# Patient Record
Sex: Male | Born: 1960 | Race: Black or African American | Hispanic: No | Marital: Married | Smoking: Never smoker
Health system: Southern US, Community
[De-identification: ages and names within clinical notes are randomized; demographics above are authoritative.]

## PROBLEM LIST (undated history)

## (undated) ENCOUNTER — Emergency Department (HOSPITAL_COMMUNITY): Admission: EM | Payer: Self-pay

## (undated) DIAGNOSIS — E119 Type 2 diabetes mellitus without complications: Secondary | ICD-10-CM

## (undated) DIAGNOSIS — E669 Obesity, unspecified: Secondary | ICD-10-CM

## (undated) DIAGNOSIS — I10 Essential (primary) hypertension: Secondary | ICD-10-CM

## (undated) HISTORY — DX: Obesity, unspecified: E66.9

## (undated) HISTORY — PX: ANKLE SURGERY: SHX546

## (undated) HISTORY — DX: Type 2 diabetes mellitus without complications: E11.9

## (undated) HISTORY — PX: LEG SURGERY: SHX1003

## (undated) HISTORY — DX: Essential (primary) hypertension: I10

---

## 2004-07-24 ENCOUNTER — Emergency Department (HOSPITAL_COMMUNITY): Admission: EM | Admit: 2004-07-24 | Discharge: 2004-07-24 | Payer: Self-pay | Admitting: Family Medicine

## 2004-08-23 ENCOUNTER — Emergency Department (HOSPITAL_COMMUNITY): Admission: EM | Admit: 2004-08-23 | Discharge: 2004-08-23 | Payer: Self-pay | Admitting: Family Medicine

## 2006-08-23 ENCOUNTER — Emergency Department (HOSPITAL_COMMUNITY): Admission: EM | Admit: 2006-08-23 | Discharge: 2006-08-23 | Payer: Self-pay | Admitting: Emergency Medicine
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
## 2012-12-31 ENCOUNTER — Ambulatory Visit: Payer: Self-pay | Admitting: Emergency Medicine

## 2012-12-31 VITALS — BP 136/84 | HR 72 | Temp 98.7°F | Resp 18 | Ht 68.5 in | Wt 274.4 lb

## 2012-12-31 DIAGNOSIS — Z0289 Encounter for other administrative examinations: Secondary | ICD-10-CM

## 2012-12-31 NOTE — Patient Instructions (Addendum)

## 2012-12-31 NOTE — Progress Notes (Signed)
[facility] [address] [place] [address] [phone]  Date:  12/31/2012   Name:  Daryl Clark   DOB:  1960-11-19   MRN:  [id number]  PCP:  No primary provider on file.    Chief Complaint: Annual Exam   History of Present Illness:  Daryl Clark is a 52 y.o. very pleasant male patient who presents with the following:  DOT certification.  Denies excess daytime sleepiness.    There are no active problems to display for this patient.   History reviewed. No pertinent past medical history.  History reviewed. No pertinent past surgical history.  History  Substance Use Topics  . Smoking status: Never Smoker   . Smokeless tobacco: Not on file  . Alcohol Use: Not on file    History reviewed. No pertinent family history.  No Known Allergies  Medication list has been reviewed and updated.  No current outpatient prescriptions on file prior to visit.   No current facility-administered medications on file prior to visit.    Review of Systems:  As per HPI, otherwise negative.    Physical Examination: Filed Vitals:   12/31/12 0950  BP: 136/84  Pulse: 72  Temp: 98.7 F (37.1 C)  Resp: 18   Filed Vitals:   12/31/12 0950  Height: 5' 8.5" (1.74 m)  Weight: 274 lb 6.4 oz (124.467 kg)   Body mass index is 41.11 kg/(m^2). Ideal Body Weight: Weight in (lb) to have BMI = 25: 166.5  GEN: WDWN, NAD, Non-toxic, A & O x 3 HEENT: Atraumatic, Normocephalic. Neck supple. No masses, No LAD. Ears and Nose: No external deformity. CV: RRR, No M/G/R. No JVD. No thrill. No extra heart sounds. PULM: CTA B, no wheezes, crackles, rhonchi. No retractions. No resp. distress. No accessory muscle use. ABD: S, NT, ND, +BS. No rebound. No HSM. EXTR: No c/c/e NEURO Normal gait.  PSYCH: Normally interactive. Conversant. Not depressed or anxious appearing.  Calm demeanor.    Assessment and Plan: DOT certification   Signed,  Phillips Odor,  MD

## 2013-01-15 ENCOUNTER — Encounter: Payer: Self-pay | Admitting: Emergency Medicine

## 2013-02-27 ENCOUNTER — Encounter (INDEPENDENT_AMBULATORY_CARE_PROVIDER_SITE_OTHER): Payer: Self-pay

## 2013-02-27 ENCOUNTER — Encounter: Payer: Self-pay | Admitting: Neurology

## 2013-02-27 ENCOUNTER — Ambulatory Visit (INDEPENDENT_AMBULATORY_CARE_PROVIDER_SITE_OTHER): Payer: Managed Care, Other (non HMO) | Admitting: Neurology

## 2013-02-27 VITALS — BP 138/89 | HR 65 | Temp 98.9°F | Ht 68.5 in | Wt 268.0 lb

## 2013-02-27 DIAGNOSIS — E669 Obesity, unspecified: Secondary | ICD-10-CM | POA: Insufficient documentation

## 2013-02-27 DIAGNOSIS — G4733 Obstructive sleep apnea (adult) (pediatric): Secondary | ICD-10-CM

## 2013-02-27 NOTE — Progress Notes (Signed)
Subjective:    Patient ID: Daryl Clark is a 52 y.o. male.  HPI  Daryl Foley, MD, PhD [facility] [address] [address] [place], [place] [address]  Dear Dr. Dareen Piano,  I saw your patient, Daryl Clark, upon your kind request in my neurologic clinic today for initial sleep evaluation as part of his DOT requirement. The patient is unaccompanied today. As you know, Mr. 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 He reports snoring and has been told in the past, that he has breathing pauses while asleep. He denies a sense of choking or coughing at night. His bedtime is around 10 PM and watches TV and often falls asleep with the TV on, but will turn it off when he wakes up in the night. He wakes up in the middle of the night 2 times, to use the bathroom. He wakes up generally fairly well rested and denies AM HAs, or EDS and his ESS is 4/24 today. He is anxious to get his DOT re-certification and his BP is a little higher than usual. He has gained wt in the last 12 months, as he is not walking as much. He quit smoking some 5-6 years ago. He drinks alcohol occasionally, 2 beers/week. He drinks tea throughout the day, latest at 2-3 PM. There is no FHx of OSA and the patient denies RLS Sx. The patient has not been taking a scheduled nap.  He has been known to snore for the past many years. Snoring is reportedly moderate, and associated with choking sounds and witnessed apneas. He is not known to kick while asleep or before falling asleep. He is not a very restless sleeper.   He denies cataplexy, sleep paralysis, hypnagogic or hypnopompic hallucinations, or sleep attacks. He does not report any vivid dreams, nightmares, dream enactments, or parasomnias, such as sleep talking or sleep walking. The patient has not had a sleep study or a home  sleep test.   His Past Medical History Is Significant For: Past Medical History  Diagnosis Date  . Obesity     His Past Surgical History Is Significant For: Past Surgical History  Procedure Laterality Date  . Ankle surgery Left   . Leg surgery Right     His Family History Is Significant For: History reviewed. No pertinent family history.  His Social History Is Significant For: History   Social History  . Marital Status: Married    Spouse Name: Daryl Clark    Number of Children: 1  . Years of Education: 12th   Occupational History  . PDA    Social History Main Topics  . Smoking status: Never Smoker   . Smokeless tobacco: None  . Alcohol Use: Yes     Comment: Occ. 2 drinks per week  . Drug Use: No  . Sexual Activity: Yes   Other Topics Concern  . None   Social History Narrative   Patient lives at home with his spouse.          His Allergies Are:  No Known Allergies:   His Current Medications Are:  No outpatient encounter prescriptions on file as of 02/27/2013.   No facility-administered encounter medications on file as of 02/27/2013.  :  Review of Systems:  Out of a complete 14 point review of systems, all are reviewed and negative with the exception of these symptoms as listed below:  Review of Systems  All other systems reviewed and are negative.    Objective:  Neurologic Exam  Physical Exam Physical Examination:   Filed Vitals:   02/27/13 0820  BP: 138/89  Pulse: 65  Temp: 98.9 F (37.2 C)    General Examination: The patient is a very pleasant 52 y.o. male in no acute distress. He appears well-developed and well-nourished and well groomed. He is obese.   HEENT: Normocephalic, atraumatic, pupils are equal, round and reactive to light and accommodation. Funduscopic exam is normal with sharp disc margins noted. Extraocular tracking is good without limitation to gaze excursion or nystagmus noted. Normal smooth pursuit is noted. Hearing is  grossly intact. Tympanic membranes are clear bilaterally. Face is symmetric with normal facial animation and normal facial sensation. Speech is clear with no dysarthria noted. There is no hypophonia. There is no lip, neck/head, jaw or voice tremor. Neck is supple with full range of passive and active motion. There are no carotid bruits on auscultation. Oropharynx exam reveals: mild mouth dryness, adequate dental hygiene and marked airway crowding, due to large tongue, narrow airway entry and tonsils in place. Mallampati is class III. Tongue protrudes centrally and palate elevates symmetrically. Tonsils are 1+ in size. Neck size is 17.75 inches.   Chest: Clear to auscultation without wheezing, rhonchi or crackles noted.  Heart: S1+S2+0, regular and normal without murmurs, rubs or gallops noted.   Abdomen: Soft, non-tender and non-distended with normal bowel sounds appreciated on auscultation.   Extremities: There is no pitting edema in the distal lower extremities bilaterally. Pedal pulses are intact. L ankle scar and mild L ankle enlargement are noted from prior injury and surgery.   Skin: Warm and dry without trophic changes noted. There are no varicose veins.  Musculoskeletal: exam reveals no obvious joint deformities, tenderness or joint swelling or erythema.   Neurologically:  Mental status: The patient is awake, alert and oriented in all 4 spheres. His memory, attention, language and knowledge are appropriate. There is no aphasia, agnosia, apraxia or anomia. Speech is clear with normal prosody and enunciation. Thought process is linear. Mood is congruent and affect is normal.  Cranial nerves are as described above under HEENT exam. In addition, shoulder shrug is normal with equal shoulder height noted. Motor exam: Normal bulk, strength and tone is noted. There is no drift, tremor or rebound. Romberg is negative. Reflexes are 2+ throughout. Toes are downgoing bilaterally. Fine motor skills are  intact with normal finger taps, normal hand movements, normal rapid alternating patting, normal foot taps and normal foot agility.  Cerebellar testing shows no dysmetria or intention tremor on finger to nose testing. Heel to shin is unremarkable bilaterally. There is no truncal or gait ataxia.  Sensory exam is intact to light touch, pinprick, vibration, temperature sense and proprioception in the upper and lower extremities.  Gait, station and balance are unremarkable. No veering to one side is noted. No leaning to one side is noted. Posture is age-appropriate and stance is narrow based. No problems turning are noted. He turns en bloc. Tandem walk is unremarkable. Intact toe and heel stance is noted.               Assessment and Plan:   In summary, MYKAEL BATZ is a very pleasant 52 y.o.-year old male with a history and physical exam concerning for obstructive sleep apnea (OSA). I had a long chat with the patient about my findings and the diagnosis, its prognosis and treatment options.  We talked about medical treatments and non-pharmacological approaches. I explained in particular the risks and ramifications of untreated moderate to severe OSA, especially with respect to developing cardiovascular disease down the Road, including congestive heart failure, difficult to treat hypertension, cardiac arrhythmias, or stroke. Even type 2 diabetes has in part been linked to untreated OSA. We talked about trying to maintain a healthy lifestyle in general, as well as the importance of weight control. I encouraged the patient to eat healthy, exercise daily and keep well hydrated, to keep a scheduled bedtime and wake time routine, to not skip any meals and eat healthy snacks in between meals.  I recommended the following at this time: sleep study with potential positive airway pressure titration.  I explained the sleep test procedure to the patient and also outlined possible surgical and non-surgical treatment  options of OSA, including the use of a custom-made dental device, upper airway surgical options, such as pillar implants, radiofrequency surgery, tongue base surgery, and UPPP. I also explained the CPAP treatment option to the patient, who indicated that he would be willing to try CPAP if the need arises. I explained the importance of being compliant with PAP treatment, not only for insurance purposes but primarily to improve His symptoms, and for the patient's long term health benefit, including to reduce His cardiovascular risks. I answered all his questions today and the patient was in agreement. I would like to see him back after the sleep study is completed and encouraged him to call with any interim questions, concerns, problems or updates.   Thank you very much for allowing me to participate in the care of this nice patient. If I can be of any further assistance to you please do not hesitate to call me at [phone].  Sincerely,   Daryl Foley, MD, PhD

## 2013-02-27 NOTE — Patient Instructions (Signed)

## 2013-03-18 ENCOUNTER — Ambulatory Visit (INDEPENDENT_AMBULATORY_CARE_PROVIDER_SITE_OTHER): Payer: Managed Care, Other (non HMO)

## 2013-03-18 DIAGNOSIS — G479 Sleep disorder, unspecified: Secondary | ICD-10-CM

## 2013-03-18 DIAGNOSIS — G4733 Obstructive sleep apnea (adult) (pediatric): Secondary | ICD-10-CM

## 2013-04-01 ENCOUNTER — Telehealth: Payer: Self-pay | Admitting: Neurology

## 2013-04-01 NOTE — Telephone Encounter (Signed)
I called and left a message for the patient that his recent sleep study showed mild obstructive sleep apnea and that Dr. Frances Furbish is recommending that he sleep off his back because that when it seems and pursue weight loss. I will fax Dr. Tinnie Gens Anderson's office a copy of the report and mail a copy to the patient's home.

## 2013-04-01 NOTE — Telephone Encounter (Signed)
Please call and notify the patient that the recent sleep study showed mild obstructive sleep apnea generally speaking. He has evidence of moderated sleep apnea only when sleeping on the back and overall, findings were mild. For this reason, He is advised to sleep off His back if possible for sleep and to pursue weight loss. Please inform patient that he can FU with PCP and I can also see him back to discuss any other treatment options, but generally speaking, for his mild overall obstructive sleep apnea, staying off his back and weight loss may suffice as treatment. Also, route or fax report to PCP and referring provider, if other than PCP. Please offer him a FU appt with me as well.  Once you have spoken to patient, you can close this encounter.   Thanks,  Huston Foley, MD, PhD [facility])

## 2013-04-01 NOTE — Telephone Encounter (Signed)
sa

## 2013-04-02 ENCOUNTER — Encounter: Payer: Self-pay | Admitting: *Deleted

## 2014-01-21 ENCOUNTER — Telehealth: Payer: Self-pay | Admitting: Emergency Medicine

## 2014-01-21 NOTE — Telephone Encounter (Signed)
Patient was seen on 12/31/2012 for a DOT PE. Patient states that he just realized today that his card was only good until 04/01/2013 (a 3 month card). Patient thought his card was written for two years. Patient states that he needs a new card immediately as in today or tomorrow. Explained to patient that because it has been over a year we cannot just write him another card without him having an OV. States that he also had a sleep apnea test done around that time as well and that his notes from the study should have been faxed here. Told patient I will deliver this message however he may have to come in for an OV in order to get another card. Please advise.    [phone]

## 2014-01-22 NOTE — Telephone Encounter (Signed)
I called and spoke with pt. He thinks his DOT card should be good until December 2015 because his sleep study was done in December of 2014. Per Dr. Dareen Piano he extended his DOT card until 12/31/2013 and instructed the pt that he needs to return in 2015 to receive another DOT card. Also per Dr. Dareen Piano if he has gained any weight since last visit he needs to have another sleep study. Pt became upset and hung up on me. Spoke to [place] and she is going to call the patient.

## 2015-01-27 ENCOUNTER — Emergency Department (HOSPITAL_COMMUNITY)
Admission: EM | Admit: 2015-01-27 | Discharge: 2015-01-27 | Disposition: A | Discharge status: Home / Self Care | Payer: Managed Care, Other (non HMO) | Attending: Emergency Medicine | Admitting: Emergency Medicine

## 2015-01-27 ENCOUNTER — Encounter (HOSPITAL_COMMUNITY): Payer: Self-pay | Admitting: *Deleted

## 2015-01-27 DIAGNOSIS — E669 Obesity, unspecified: Secondary | ICD-10-CM | POA: Diagnosis not present

## 2015-01-27 DIAGNOSIS — K625 Hemorrhage of anus and rectum: Secondary | ICD-10-CM | POA: Insufficient documentation

## 2015-01-27 LAB — I-STAT CHEM 8, ED
BUN: 16 mg/dL (ref 6–20)
CREATININE: 1.2 mg/dL (ref 0.61–1.24)
Calcium, Ion: 1.06 mmol/L — ABNORMAL LOW (ref 1.12–1.23)
Chloride: 104 mmol/L (ref 101–111)
GLUCOSE: 140 mg/dL — AB (ref 65–99)
HCT: 42 % (ref 39.0–52.0)
HEMOGLOBIN: 14.3 g/dL (ref 13.0–17.0)
POTASSIUM: 4 mmol/L (ref 3.5–5.1)
Sodium: 140 mmol/L (ref 135–145)
TCO2: 24 mmol/L (ref 0–100)

## 2015-01-27 LAB — POC OCCULT BLOOD, ED: Fecal Occult Bld: NEGATIVE

## 2015-01-27 NOTE — ED Provider Notes (Signed)
CSN: [id number]     Arrival date & time 01/27/15  1729 History   First MD Initiated Contact with Patient 01/27/15 2029     Chief Complaint  Patient presents with  . Blood In Stools     (Consider location/radiation/quality/duration/timing/severity/associated sxs/prior Treatment) Patient is a 54 y.o. male presenting with hematochezia. The history is provided by the patient.  Rectal Bleeding Quality:  Bright red Amount:  Moderate Duration:  1 day Timing:  Rare Progression:  Resolved Chronicity:  New Context: not anal fissures, not anal penetration, not constipation and not diarrhea   Similar prior episodes: no   Relieved by:  Nothing Worsened by:  Nothing tried Ineffective treatments:  None tried Associated symptoms: abdominal pain (RLQ for about 10 min, resolved)   Associated symptoms: no fever and no vomiting    54 yo M with a chief complaint of rectal bleeding. Patient had one episode this morning. Since then has had more episodes that have not had blood in it. Stool is soft and brown. Denies fevers or chills. Had some mild right lower quadrant cramping just prior to the bright red blood in the bowl. Denies near-syncope feeling. Denies fevers or chills. Denies current abdominal pain.  Past Medical History  Diagnosis Date  . Obesity    Past Surgical History  Procedure Laterality Date  . Ankle surgery Left   . Leg surgery Right    History reviewed. No pertinent family history. Social History  Substance Use Topics  . Smoking status: Never Smoker   . Smokeless tobacco: None  . Alcohol Use: Yes     Comment: Occ. 2 drinks per week    Review of Systems  Constitutional: Negative for fever and chills.  HENT: Negative for congestion and facial swelling.   Eyes: Negative for discharge and visual disturbance.  Respiratory: Negative for shortness of breath.   Cardiovascular: Negative for chest pain and palpitations.  Gastrointestinal: Positive for abdominal pain (RLQ for about  10 min, resolved), blood in stool and hematochezia. Negative for vomiting and diarrhea.  Musculoskeletal: Negative for myalgias and arthralgias.  Skin: Negative for color change and rash.  Neurological: Negative for tremors, syncope and headaches.  Psychiatric/Behavioral: Negative for confusion and dysphoric mood.      Allergies  Review of patient's allergies indicates no known allergies.  Home Medications   Prior to Admission medications   Medication Sig Start Date End Date Taking? Authorizing Provider  acetaminophen (TYLENOL) 500 MG tablet Take 500 mg by mouth every 6 (six) hours as needed.   Yes Historical Provider, MD   BP 131/82 mmHg  Pulse 69  Temp(Src) 98.6 F (37 C) (Oral)  Resp 20  SpO2 97% Physical Exam  Constitutional: He is oriented to person, place, and time. He appears well-developed and well-nourished.  HENT:  Head: Normocephalic and atraumatic.  Eyes: EOM are normal. Pupils are equal, round, and reactive to light.  Neck: Normal range of motion. Neck supple. No JVD present.  Cardiovascular: Normal rate and regular rhythm.  Exam reveals no gallop and no friction rub.   No murmur heard. Pulmonary/Chest: No respiratory distress. He has no wheezes.  Abdominal: He exhibits no distension. There is no tenderness. There is no rebound and no guarding.  Musculoskeletal: Normal range of motion.  Neurological: He is alert and oriented to person, place, and time.  Skin: No rash noted. No pallor.  Psychiatric: He has a normal mood and affect. His behavior is normal.    ED Course  Procedures (including  critical care time) Labs Review Labs Reviewed  I-STAT CHEM 8, ED - Abnormal; Notable for the following:    Glucose, Bld 140 (*)    Calcium, Ion 1.06 (*)    All other components within normal limits  POC OCCULT BLOOD, ED    Imaging Review No results found. I have personally reviewed and evaluated these images and lab results as part of my medical decision-making.    EKG Interpretation None      MDM   Final diagnoses:  BRBPR (bright red blood per rectum)    54 year old male with a chief complaint rectal bleeding. Rectal exam reveals soft brown stool heme-negative. Hemoglobin normal. Will discharge home. PCP follow-up.  9:42 PM:  I have discussed the diagnosis/risks/treatment options with the patient and family and believe the pt to be eligible for discharge home to follow-up with PCP. We also discussed returning to the ED immediately if new or worsening sx occur. We discussed the sx which are most concerning (e.g., sudden worsening, near syncope, abdominal pain, fever) that necessitate immediate return. Medications administered to the patient during their visit and any new prescriptions provided to the patient are listed below.  Medications given during this visit Medications - No data to display  New Prescriptions   No medications on file     The patient appears reasonably screen and/or stabilized for discharge and I doubt any other medical condition or other [facility] requiring further screening, evaluation, or treatment in the ED at this time prior to discharge.      Melene Plan, DO 01/27/15 2142

## 2015-01-27 NOTE — ED Notes (Signed)
Patient reports blood in stool this am, states no blood noted in stool tonight however. Denies any abdominal pain. Blood noted to be right red. Denies any dizziness or weakness.

## 2015-01-27 NOTE — Discharge Instructions (Signed)
Bloody Stools  Bloody stools often mean that there is a problem in the digestive tract. Your caregiver may use the term "melena" to describe black, tarry, and bad smelling stools or "hematochezia" to describe red or maroon-colored stools. Blood seen in the stool can be caused by bleeding anywhere along the intestinal tract.   A black stool usually means that blood is coming from the upper part of the gastrointestinal tract (esophagus, stomach, or small bowel). Passing maroon-colored stools or bright red blood usually means that blood is coming from lower down in the large bowel or the rectum. However, sometimes massive bleeding in the stomach or small intestine can cause bright red bloody stools.   Consuming black licorice, lead, iron pills, medicines containing bismuth subsalicylate, or blueberries can also cause black stools. Your caregiver can test black stools to see if blood is present.  It is important that the cause of the bleeding be found. Treatment can then be started, and the problem can be corrected. Rectal bleeding may not be serious, but you should not assume everything is okay until you know the cause. It is very important to follow up with your caregiver or a specialist in gastrointestinal problems.  CAUSES   Blood in the stools can come from various underlying causes. Often, the cause is not found during your first visit. Testing is often needed to discover the cause of bleeding in the gastrointestinal tract. Causes range from simple to serious or even life-threatening. Possible causes include:  · Hemorrhoids. These are veins that are full of blood (engorged) in the rectum. They cause pain, inflammation, and may bleed.  · Anal fissures. These are areas of painful tearing which may bleed. They are often caused by passing hard stool.  · Diverticulosis. These are pouches that form on the colon over time, with age, and may bleed significantly.  · Diverticulitis. This is inflammation in areas with  diverticulosis. It can cause pain, fever, and bloody stools, although bleeding is rare.  · Proctitis and colitis. These are inflamed areas of the rectum or colon. They may cause pain, fever, and bloody stools.  · Polyps and cancer. Colon cancer is a leading cause of preventable cancer death. It often starts out as precancerous polyps that can be removed during a colonoscopy, preventing progression into cancer. Sometimes, polyps and cancer may cause rectal bleeding.  · Gastritis and ulcers. Bleeding from the upper gastrointestinal tract (near the stomach) may travel through the intestines and produce black, sometimes tarry, often bad smelling stools. In certain cases, if the bleeding is fast enough, the stools may not be black, but red and the condition may be life-threatening.  SYMPTOMS   You may have stools that are bright red and bloody, that are normal color with blood on them, or that are dark black and tarry. In some cases, you may only have blood in the toilet bowl. Any of these cases need medical care. You may also have:  · Pain at the anus or anywhere in the rectum.  · Lightheadedness or feeling faint.  · Extreme weakness.  · Nausea or vomiting.  · Fever.  DIAGNOSIS  Your caregiver may use the following methods to find the cause of your bleeding:  · Taking a medical history. Age is important. Older people tend to develop polyps and cancer more often. If there is anal pain and a hard, large stool associated with bleeding, a tear of the anus may be the cause. If blood drips into the toilet after a bowel movement, bleeding hemorrhoids may be the   problem. The color and frequency of the bleeding are additional considerations. In most cases, the medical history provides clues, but seldom the final answer.  · A visual and finger (digital) exam. Your caregiver will inspect the anal area, looking for tears and hemorrhoids. A finger exam can provide information when there is tenderness or a growth inside. In men, the  prostate is also examined.  · Endoscopy. Several types of small, long scopes (endoscopes) are used to view the colon.  ¨ In the office, your caregiver may use a rigid, or more commonly, a flexible viewing sigmoidoscope. This exam is called flexible sigmoidoscopy. It is performed in 5 to 10 minutes.  ¨ A more thorough exam is accomplished with a colonoscope. It allows your caregiver to view the entire 5 to 6 foot long colon. Medicine to help you relax (sedative) is usually given for this exam. Frequently, a bleeding lesion may be present beyond the reach of the sigmoidoscope. So, a colonoscopy may be the best exam to start with. Both exams are usually done on an outpatient basis. This means the patient does not stay overnight in the hospital or surgery center.  ¨ An upper endoscopy may be needed to examine your stomach. Sedation is used and a flexible endoscope is put in your mouth, down to your stomach.  · A barium enema X-ray. This is an X-ray exam. It uses liquid barium inserted by enema into the rectum. This test alone may not identify an actual bleeding point. X-rays highlight abnormal shadows, such as those made by lumps (tumors), diverticuli, or colitis.  TREATMENT   Treatment depends on the cause of your bleeding.   · For bleeding from the stomach or colon, the caregiver doing your endoscopy or colonoscopy may be able to stop the bleeding as part of the procedure.  · Inflammation or infection of the colon can be treated with medicines.  · Many rectal problems can be treated with creams, suppositories, or warm baths.  · Surgery is sometimes needed.  · Blood transfusions are sometimes needed if you have lost a lot of blood.  · For any bleeding problem, let your caregiver know if you take aspirin or other blood thinners regularly.  HOME CARE INSTRUCTIONS   · Take any medicines exactly as prescribed.  · Keep your stools soft by eating a diet high in fiber. Prunes (1 to 3 a day) work well for many people.  · Drink  enough water and fluids to keep your urine clear or pale yellow.  · Take sitz baths if advised. A sitz bath is when you sit in a bathtub with warm water for 10 to 15 minutes to soak, soothe, and cleanse the rectal area.  · If enemas or suppositories are advised, be sure you know how to use them. Tell your caregiver if you have problems with this.  · Monitor your bowel movements to look for signs of improvement or worsening.  SEEK MEDICAL CARE IF:   · You do not improve in the time expected.  · Your condition worsens after initial improvement.  · You develop any new symptoms.  SEEK IMMEDIATE MEDICAL CARE IF:   · You develop severe or prolonged rectal bleeding.  · You vomit blood.  · You feel weak or faint.  · You have a fever.  MAKE SURE YOU:  · Understand these instructions.  · Will watch your condition.  · Will get help right away if you are not doing well or get worse.    Document Released: 04/08/2002 Document Revised: 07/11/2011 Document Reviewed: 09/03/2010  [employer]® Patient Information ©2015 [employer]. This information is not intended to replace advice given to you by your health care provider. Make sure you discuss any questions you have with your health care provider.

## 2017-05-02 DIAGNOSIS — I10 Essential (primary) hypertension: Secondary | ICD-10-CM

## 2017-05-02 HISTORY — DX: Essential (primary) hypertension: I10

## 2018-05-02 HISTORY — PX: COLONOSCOPY: SHX174

## 2018-07-20 ENCOUNTER — Other Ambulatory Visit: Payer: Self-pay

## 2018-07-20 ENCOUNTER — Encounter: Payer: Self-pay | Admitting: Medical

## 2018-07-20 ENCOUNTER — Ambulatory Visit: Payer: Managed Care, Other (non HMO) | Admitting: Medical

## 2018-07-20 VITALS — BP 160/80 | HR 102 | Temp 98.5°F | Resp 16 | Ht 68.0 in | Wt 272.6 lb

## 2018-07-20 DIAGNOSIS — R202 Paresthesia of skin: Secondary | ICD-10-CM

## 2018-07-20 DIAGNOSIS — I10 Essential (primary) hypertension: Secondary | ICD-10-CM

## 2018-07-20 MED ORDER — AMLODIPINE BESYLATE 10 MG PO TABS: 10.0000 mg | ORAL_TABLET | Freq: Every day | ORAL | 0 refills | Status: DC

## 2018-07-20 NOTE — Progress Notes (Signed)
Subjective:     Patient ID: Daryl Clark, male   DOB: [birth year], 58 y.o.   MRN: [id number]  HPI  Here as a new patient today.  Was going to [facility] in [place] prior.  He report left hand numbness intermittent and soreness x 1 months.  No specific injury, but does recall lifting a garage door a month ago. Didn't think this caused the issue at the time, but that is the only different thing out of the ordinary he recalls in the last month.   No weakness, no frank pain, just soreness in left wrist and hand. When he gets the numbness its in the whole hand.  Denies dropping objects.   No redness, no swelling, no other arm pain or numbness.  No neck pain, no shoulder or upper arm pain.   No right arm problems.  Is right handed .  Is a delivery driver.   No medication to help.   Hasn't used a brace or splint.     Has hx/o HTN.   Has been on medication in the past.   Last BP medication was 2 years ago.    No chest pain, no dyspnea, no leg swelling.   Exercise with walking and playing with grandchildren.  Has gained some weight over time.  No other aggravating or relieving factors. No other complaint.   Past Medical History:  Diagnosis Date  . Hypertension   . Obesity    Current Outpatient Medications on File Prior to Visit  Medication Sig Dispense Refill  . acetaminophen (TYLENOL) 500 MG tablet Take 500 mg by mouth every 6 (six) hours as needed.     No current facility-administered medications on file prior to visit.       Review of Systems As in subjective    Objective:   Physical Exam BP (!) 160/80   Pulse (!) 102   Temp 98.5 F (36.9 C) (Oral)   Resp 16   Ht 5\' 8"  (1.727 m)   Wt 272 lb 9.6 oz (123.7 kg)   SpO2 97%   BMI 41.45 kg/m   General appearance: alert, no distress, WD/WN, obese AA male Neck: supple, no lymphadenopathy, no thyromegaly, no masses, nonntender, normal ROM Heart: RRR, normal S1, S2, no murmurs Lungs: CTA bilaterally, no wheezes, rhonchi, or  rales Left hand, wrist, arm nontender, no deformity, no swelling, rest of UE unremarkable Neuro: bilat UE normal sensational, strength, DTRs, and -phalens and tinels ExT: no edema Pulses: 2+ symmetric, upper and lower extremities, normal cap refill      Assessment:     Encounter Diagnoses  Name Primary?  . Paresthesia of left arm Yes  . Essential hypertension, benign         Plan:     We discussed the findings, symptoms, concerns and recommendations as below   Patient Instructions  High blood pressure  Begin back on amlodipine blood pressure medication.  Start 1/2 tablet daily for 1 week, then go up to 1 tablet daily  Limit your salt intake, do not add any salt to your food  Eat a healthy low-fat diet and avoid a lot of fast food or junk food  Exercise at least 150 minutes per week with walking or jogging or bicycling  Lets recheck in 1 month for physical and fasting labs   Numbness of left arm  Begin using reinforced arm splint/wrist splint of left arm at nighttime for the next 2 to 3 weeks  Do not use this  during the day except for weekends if you are not doing anything active  Consider wrapping your left arm in a towel to keep the arm relatively straight at bedtime  Avoid sleeping directly on the left arm and use pillows to cushion pressure on the arm during sleep  If not much improved within the next 3 to 4 weeks then we may need to get orthopedics involved  I would recommend using Aleve over-the-counter once daily for the next 5 days and then stop this    Hypertension (high blood pressure):   Hypertension, commonly called high blood pressure, is when the force of blood pumping through your arteries is too strong. Your arteries are the blood vessels that carry blood from your heart throughout your body. A blood pressure reading consists of a higher number over a lower number, such as 110/72. The higher number (systolic) is the pressure inside your arteries  when your heart pumps. The lower number (diastolic) is the pressure inside your arteries when your heart relaxes. Ideally you want your blood pressure below 120/80. Hypertension forces your heart to work harder to pump blood. Your arteries may become narrow or stiff. Having hypertension puts you at risk for heart disease, stroke, and other problems.  RISK FACTORS Some risk factors for high blood pressure are controllable. Others are not.  Risk factors you cannot control include:   Race. You may be at higher risk if you are African American.  Age. Risk increases with age.  Gender. Men are at higher risk than women before age 76 years. After age 29, women are at higher risk than men. Risk factors you can control include:  Not getting enough exercise or physical activity.  Being overweight.  Getting too much fat, sugar, calories, or salt in your diet.  Drinking too much alcohol. SIGNS AND SYMPTOMS Hypertension does not usually cause signs or symptoms. Extremely high blood pressure (hypertensive crisis) may cause headache, anxiety, shortness of breath, and nosebleed. DIAGNOSIS  To check if you have hypertension, your health care provider will measure your blood pressure while you are seated, with your arm held at the level of your heart. It should be measured at least twice using the same arm. Certain conditions can cause a difference in blood pressure between your right and left arms. A blood pressure reading that is higher than normal on one occasion does not mean that you need treatment. If one blood pressure reading is high, ask your health care provider about having it checked again. BLOOD PRESSURE STAGES Blood pressure is classified into four stages: normal, pre hypertension, stage 1, and stage 2. Your blood pressure reading will be used to determine what type of treatment, if any, is necessary. Appropriate treatment options are tied to these four stages:  Normal  Systolic pressure (mm  Hg): below 120.  Diastolic pressure (mm Hg): below 80. Pre hypertension  Systolic pressure (mm Hg): 120 to 139.  Diastolic pressure (mm Hg): 80 to 89. Stage1  Systolic pressure (mm Hg): 140 to 159.  Diastolic pressure (mm Hg): 90 to 99. Stage2  Systolic pressure (mm Hg): 160 or above.  Diastolic pressure (mm Hg): 100 or above. RISKS RELATED TO HIGH BLOOD PRESSURE Managing your blood pressure is an important responsibility. Uncontrolled high blood pressure can lead to:  A heart attack.  A stroke.  A weakened blood vessel (aneurysm).  Heart failure.  Kidney damage.  Eye damage.  Metabolic syndrome.  Memory and concentration problems. TREATMENT  Treating high blood  pressure includes making lifestyle changes and possibly taking medicine. Living a healthy lifestyle can help lower high blood pressure. You may need to change some of your habits. Lifestyle changes may include:  Following the DASH diet. This diet is high in fruits, vegetables, and whole grains. It is low in salt, red meat, and added sugars.  Getting at least 2 hours of brisk physical activity every week.  Losing weight if necessary.  Not smoking.  Limiting alcoholic beverages.  Learning ways to reduce stress. If lifestyle changes are not enough to get your blood pressure under control, your health care provider may prescribe medicine. You may need to take more than one. Work closely with your health care provider to understand the risks and benefits. HOME CARE INSTRUCTIONS  Have your blood pressure rechecked as directed by your health care provider.   Take medicines only as directed by your health care provider. Follow the directions carefully. Blood pressure medicines must be taken as prescribed. The medicine does not work as well when you skip doses. Skipping doses also puts you at risk for problems.   Do not smoke.   Monitor your blood pressure at home as directed by your health care  provider. SEEK MEDICAL CARE IF:   You think you are having a reaction to medicines taken.  You have recurrent headaches or feel dizzy.  You have swelling in your ankles.  You have trouble with your vision. SEEK IMMEDIATE MEDICAL CARE IF:  You develop a severe headache or confusion.  You have unusual weakness, numbness, or feel faint.  You have severe chest or abdominal pain.  You vomit repeatedly.  You have trouble breathing. MAKE SURE YOU:   Understand these instructions.  Will watch your condition.  Will get help right away if you are not doing well or get worse. Document Released: 04/18/2005 Document Revised: 09/02/2013 Document Reviewed: 02/08/2013 [facility] Patient Information 2015 [place], [place]. This information is not intended to replace advice given to you by your health care provider. Make sure you discuss any questions you have with your health care provider.

## 2018-07-20 NOTE — Patient Instructions (Signed)
High blood pressure  Begin back on amlodipine blood pressure medication.  Start 1/2 tablet daily for 1 week, then go up to 1 tablet daily  Limit your salt intake, do not add any salt to your food  Eat a healthy low-fat diet and avoid a lot of fast food or junk food  Exercise at least 150 minutes per week with walking or jogging or bicycling  Lets recheck in 1 month for physical and fasting labs   Numbness of left arm  Begin using reinforced arm splint/wrist splint of left arm at nighttime for the next 2 to 3 weeks  Do not use this during the day except for weekends if you are not doing anything active  Consider wrapping your left arm in a towel to keep the arm relatively straight at bedtime  Avoid sleeping directly on the left arm and use pillows to cushion pressure on the arm during sleep  If not much improved within the next 3 to 4 weeks then we may need to get orthopedics involved  I would recommend using Aleve over-the-counter once daily for the next 5 days and then stop this    Hypertension (high blood pressure):   Hypertension, commonly called high blood pressure, is when the force of blood pumping through your arteries is too strong. Your arteries are the blood vessels that carry blood from your heart throughout your body. A blood pressure reading consists of a higher number over a lower number, such as 110/72. The higher number (systolic) is the pressure inside your arteries when your heart pumps. The lower number (diastolic) is the pressure inside your arteries when your heart relaxes. Ideally you want your blood pressure below 120/80. Hypertension forces your heart to work harder to pump blood. Your arteries may become narrow or stiff. Having hypertension puts you at risk for heart disease, stroke, and other problems.  RISK FACTORS Some risk factors for high blood pressure are controllable. Others are not.  Risk factors you cannot control include:   Race. You may be at  higher risk if you are African American.  Age. Risk increases with age.  Gender. Men are at higher risk than women before age 88 years. After age 44, women are at higher risk than men. Risk factors you can control include:  Not getting enough exercise or physical activity.  Being overweight.  Getting too much fat, sugar, calories, or salt in your diet.  Drinking too much alcohol. SIGNS AND SYMPTOMS Hypertension does not usually cause signs or symptoms. Extremely high blood pressure (hypertensive crisis) may cause headache, anxiety, shortness of breath, and nosebleed. DIAGNOSIS  To check if you have hypertension, your health care provider will measure your blood pressure while you are seated, with your arm held at the level of your heart. It should be measured at least twice using the same arm. Certain conditions can cause a difference in blood pressure between your right and left arms. A blood pressure reading that is higher than normal on one occasion does not mean that you need treatment. If one blood pressure reading is high, ask your health care provider about having it checked again. BLOOD PRESSURE STAGES Blood pressure is classified into four stages: normal, pre hypertension, stage 1, and stage 2. Your blood pressure reading will be used to determine what type of treatment, if any, is necessary. Appropriate treatment options are tied to these four stages:  Normal  Systolic pressure (mm Hg): below 120.  Diastolic pressure (mm Hg): below  80. Pre hypertension  Systolic pressure (mm Hg): 120 to 139.  Diastolic pressure (mm Hg): 80 to 89. Stage1  Systolic pressure (mm Hg): 140 to 159.  Diastolic pressure (mm Hg): 90 to 99. Stage2  Systolic pressure (mm Hg): 160 or above.  Diastolic pressure (mm Hg): 100 or above. RISKS RELATED TO HIGH BLOOD PRESSURE Managing your blood pressure is an important responsibility. Uncontrolled high blood pressure can lead to:  A heart attack.   A stroke.  A weakened blood vessel (aneurysm).  Heart failure.  Kidney damage.  Eye damage.  Metabolic syndrome.  Memory and concentration problems. TREATMENT  Treating high blood pressure includes making lifestyle changes and possibly taking medicine. Living a healthy lifestyle can help lower high blood pressure. You may need to change some of your habits. Lifestyle changes may include:  Following the DASH diet. This diet is high in fruits, vegetables, and whole grains. It is low in salt, red meat, and added sugars.  Getting at least 2 hours of brisk physical activity every week.  Losing weight if necessary.  Not smoking.  Limiting alcoholic beverages.  Learning ways to reduce stress. If lifestyle changes are not enough to get your blood pressure under control, your health care provider may prescribe medicine. You may need to take more than one. Work closely with your health care provider to understand the risks and benefits. HOME CARE INSTRUCTIONS  Have your blood pressure rechecked as directed by your health care provider.   Take medicines only as directed by your health care provider. Follow the directions carefully. Blood pressure medicines must be taken as prescribed. The medicine does not work as well when you skip doses. Skipping doses also puts you at risk for problems.   Do not smoke.   Monitor your blood pressure at home as directed by your health care provider. SEEK MEDICAL CARE IF:   You think you are having a reaction to medicines taken.  You have recurrent headaches or feel dizzy.  You have swelling in your ankles.  You have trouble with your vision. SEEK IMMEDIATE MEDICAL CARE IF:  You develop a severe headache or confusion.  You have unusual weakness, numbness, or feel faint.  You have severe chest or abdominal pain.  You vomit repeatedly.  You have trouble breathing. MAKE SURE YOU:   Understand these instructions.  Will watch  your condition.  Will get help right away if you are not doing well or get worse. Document Released: 04/18/2005 Document Revised: 09/02/2013 Document Reviewed: 02/08/2013 [facility] Patient Information 2015 [place], [place]. This information is not intended to replace advice given to you by your health care provider. Make sure you discuss any questions you have with your health care provider.

## 2018-08-31 ENCOUNTER — Encounter: Payer: Managed Care, Other (non HMO) | Admitting: Medical

## 2018-10-10 ENCOUNTER — Other Ambulatory Visit: Payer: Self-pay | Admitting: Medical

## 2020-12-01 ENCOUNTER — Encounter: Payer: Self-pay | Admitting: Medical

## 2020-12-01 ENCOUNTER — Other Ambulatory Visit: Payer: Self-pay

## 2020-12-01 ENCOUNTER — Ambulatory Visit (INDEPENDENT_AMBULATORY_CARE_PROVIDER_SITE_OTHER): Payer: Managed Care, Other (non HMO) | Admitting: Medical

## 2020-12-01 VITALS — BP 150/100 | HR 91 | Ht 67.0 in | Wt 281.4 lb

## 2020-12-01 DIAGNOSIS — I10 Essential (primary) hypertension: Secondary | ICD-10-CM

## 2020-12-01 MED ORDER — AMLODIPINE BESYLATE 10 MG PO TABS: 10.0000 mg | ORAL_TABLET | Freq: Every day | ORAL | 0 refills | Status: DC

## 2020-12-01 NOTE — Progress Notes (Signed)
Subjective:  Daryl Clark is a 60 y.o. male who presents for Chief Complaint  Patient presents with   med check    Med check. Bp has been running 144/91. Was on amlodipine 10mg  back in 2019 and thinks he needs to be back on it     Here for f/u.  Last visit > a year ago.   Was on BP medication prior, and has been out of medication.   He notes home BPs have been elevated.  He notes hypertension diagnosis 2019.   No other known diagnoses or health issues.   No current chest pain, dyspnea, palpitations.  He lost job and insurance since last time here in 2020.    Nonsmoker.   Doesn't exercise other than walking.   Working as 2021.  He notes sleep apnea test 3 year ago and he states this was normal.    No other aggravating or relieving factors.    No other c/o.  The following portions of the patient's history were reviewed and updated as appropriate: allergies, current medications, past family history, past medical history, past social history, past surgical history and problem list.  ROS Otherwise as in subjective above     Objective: BP (!) 150/100   Pulse 91   Ht 5\' 7"  (1.702 m)   Wt 281 lb 6.4 oz (127.6 kg)   SpO2 99%   BMI 44.07 kg/m   General appearance: alert, no distress, well developed, well nourished Neck: supple, no lymphadenopathy, no thyromegaly, no masses, no bruits Heart: RRR, normal S1, S2, no murmurs Lungs: CTA bilaterally, no wheezes, rhonchi, or rales Pulses: 2+ radial pulses, 2+ pedal pulses, normal cap refill Ext: no edema   Assessment: Encounter Diagnosis  Name Primary?   Essential hypertension, benign Yes     Plan: Hypertension - restart medication, limit salt, get regular exercise, monitor home BPs, work on losing weight.  Discussed possible complications of uncontrolled hypertension.     Daryl Clark was seen today for med check.  Diagnoses and all orders for this visit:  Essential hypertension, benign  Other orders -      amLODipine (NORVASC) 10 MG tablet; Take 1 tablet (10 mg total) by mouth daily.   Follow up: 2-3 mo for fasting physical

## 2020-12-09 ENCOUNTER — Encounter: Payer: Self-pay | Admitting: Medical

## 2020-12-09 ENCOUNTER — Telehealth (INDEPENDENT_AMBULATORY_CARE_PROVIDER_SITE_OTHER): Payer: Managed Care, Other (non HMO) | Admitting: Medical

## 2020-12-09 ENCOUNTER — Other Ambulatory Visit: Payer: Self-pay

## 2020-12-09 VITALS — BP 132/87 | HR 111 | Wt 280.0 lb

## 2020-12-09 DIAGNOSIS — K5900 Constipation, unspecified: Secondary | ICD-10-CM

## 2020-12-09 DIAGNOSIS — R109 Unspecified abdominal pain: Secondary | ICD-10-CM | POA: Diagnosis not present

## 2020-12-09 DIAGNOSIS — R110 Nausea: Secondary | ICD-10-CM | POA: Diagnosis not present

## 2020-12-09 MED ORDER — PROMETHAZINE HCL 12.5 MG RE SUPP: 12.5000 mg | Freq: Four times a day (QID) | RECTAL | 0 refills | Status: DC | PRN

## 2020-12-09 NOTE — Progress Notes (Signed)
This visit type was conducted due to national recommendations for restrictions regarding the COVID-19 Pandemic (e.g. social distancing) in an effort to limit this patient's exposure and mitigate transmission in our community.  Due to their co-morbid illnesses, this patient is at least at moderate risk for complications without adequate follow up.  This format is felt to be most appropriate for this patient at this time.    Documentation for virtual audio and video telecommunications through Trail encounter:  The patient was located at home. The provider was located in the office. The patient did consent to this visit and is aware of possible charges through their insurance for this visit.  The other persons participating in this telemedicine service were none. Time spent on call was 18 minutes and in review of previous records >25 minutes total.  This virtual service is not related to other E/M service within previous 7 days.  Subjective:  Daryl Clark is a 60 y.o. male who presents for Chief Complaint  Patient presents with   vomitting    Vomitting x 3 days. No diarrhea- hasn't used bathroom in 3 days. Tried magnesium nitrate and thrown it up back up. Stomach pain and cramping     Virtual consult for vomiting and abdominal discomfort.  He notes some abdominal discomfort and cramping mostly after vomiting the last 3 days.  He has been vomiting several times and nauseated.  He threw up this morning but no vomiting since this morning.  He gets abdominal discomfort after vomiting.  He is concerned as he has not had a bowel movement in 3 days.  He normally has a daily bowel movement.  He attributes this to some biscuits he ate a few days ago.  These biscuits were out of date and he thinks they may have upset his stomach.  No other unusual foods or foul odorous foods.     Currently every time he tries to take a medicine or eat something he throws it right back up.  He denies any stool  in the last 3 days and no diarrhea  He denies fever, chills.  No blood, pain in stomach after vomiting.  He does note +hot flashes,  No other aggravating or relieving factors.    No other c/o.  Past Medical History:  Diagnosis Date   Hypertension 2019   Obesity      The following portions of the patient's history were reviewed and updated as appropriate: allergies, current medications, past family history, past medical history, past social history, past surgical history and problem list.  ROS Otherwise as in subjective above  Objective: BP 132/87   Pulse (!) 111   Wt 280 lb (127 kg)   BMI 43.85 kg/m   Wt Readings from Last 3 Encounters:  12/09/20 280 lb (127 kg)  12/01/20 281 lb 6.4 oz (127.6 kg)  07/20/18 272 lb 9.6 oz (123.7 kg)    General appearance: alert, no distress We discussed the limits of virtual consult.   Assessment: Encounter Diagnoses  Name Primary?   Abdominal pain, unspecified abdominal location Yes   Constipation, unspecified constipation type    Nausea      Plan: We discussed differential for her symptoms.  We also discussed that not evaluating in person is hard to tell exactly what is going on.  Differential could include constipation, dyspepsia, bowel obstruction, or other  He will go for abdominal x-ray upright KUB  He will begin salt water enema he has at home currently.  I prescribed promethazine suppository below to help with nausea.  We discussed timing of the medication and proper use of the medication.  We discussed risk and benefits of medication  If the enema helps and if the nausea medicine helps he can gradually slowly advance some liquids and food but caution on being too quick with this  [name] was seen today for vomitting.  Diagnoses and all orders for this visit:  Abdominal pain, unspecified abdominal location -     DG Abd 1 View; Future  Constipation, unspecified constipation type -     DG Abd 1 View;  Future  Nausea  Other orders -     promethazine (PHENERGAN) 12.5 MG suppository; Place 1 suppository (12.5 mg total) rectally every 6 (six) hours as needed for nausea or vomiting.   Follow up: pending xray

## 2021-02-26 ENCOUNTER — Other Ambulatory Visit: Payer: Self-pay | Admitting: Medical

## 2021-03-10 ENCOUNTER — Encounter: Payer: Managed Care, Other (non HMO) | Admitting: Medical

## 2021-05-28 ENCOUNTER — Other Ambulatory Visit: Payer: Self-pay | Admitting: Medical

## 2021-05-28 NOTE — Telephone Encounter (Signed)
Attempted to call pt, he has no voice mail. Pt doesn't not have my chart .Pt needs an appt.

## 2021-05-28 NOTE — Telephone Encounter (Signed)
Please schedule pt for a physical. He is overdue.

## 2023-05-05 ENCOUNTER — Ambulatory Visit (INDEPENDENT_AMBULATORY_CARE_PROVIDER_SITE_OTHER): Payer: Self-pay | Admitting: Medical

## 2023-05-05 VITALS — BP 164/96 | HR 117 | Wt 271.4 lb

## 2023-05-05 DIAGNOSIS — R3589 Other polyuria: Secondary | ICD-10-CM

## 2023-05-05 DIAGNOSIS — R631 Polydipsia: Secondary | ICD-10-CM

## 2023-05-05 DIAGNOSIS — I10 Essential (primary) hypertension: Secondary | ICD-10-CM

## 2023-05-05 DIAGNOSIS — E119 Type 2 diabetes mellitus without complications: Secondary | ICD-10-CM

## 2023-05-05 LAB — POCT URINALYSIS DIP (PROADVANTAGE DEVICE)
Bilirubin, UA: NEGATIVE
Glucose, UA: >=1000 mg/dL — AB
Leukocytes, UA: NEGATIVE
Nitrite, UA: NEGATIVE
Protein Ur, POC: 30 mg/dL — AB
Specific Gravity, Urine: 1.01
Urobilinogen, Ur: 0.2
pH, UA: 6 (ref 5.0–8.0)

## 2023-05-05 MED ORDER — LOSARTAN POTASSIUM-HCTZ 50-12.5 MG PO TABS: 1.0000 | ORAL_TABLET | Freq: Every day | ORAL | 2 refills | Status: DC

## 2023-05-05 MED ORDER — LANCETS MISC. MISC: 1.0000 | Freq: Three times a day (TID) | 0 refills | Status: AC

## 2023-05-05 MED ORDER — INSULIN REGULAR HUMAN 100 UNIT/ML IJ SOLN: 8.0000 [IU] | Freq: Three times a day (TID) | INTRAMUSCULAR | 2 refills | Status: DC

## 2023-05-05 MED ORDER — BLOOD GLUCOSE MONITORING SUPPL DEVI: 1.0000 | Freq: Three times a day (TID) | 0 refills | Status: AC

## 2023-05-05 MED ORDER — BLOOD GLUCOSE TEST VI STRP: 1.0000 | ORAL_STRIP | Freq: Three times a day (TID) | 0 refills | Status: AC

## 2023-05-05 MED ORDER — BD PEN NEEDLE NANO U/F 32G X 4 MM MISC: 1.0000 | Freq: Every day | 1 refills | Status: AC

## 2023-05-05 NOTE — Addendum Note (Signed)
 Addended byLawernce Pitts on: 05/05/2023 02:15 PM   Modules accepted: Orders

## 2023-05-05 NOTE — Progress Notes (Signed)
 Subjective:  Daryl Clark is a 63 y.o. male who presents for Chief Complaint  Patient presents with   Urinary Tract Infection    Pain once but took Azo, urinary frequency, flakes in urine. No recent sexual activity.      Here for urinary symptoms.  Last visit here 2022.  He has not been taking his BP medication  He was in usual state of health until 2 weeks ago has been feeling increase urination frequency and some increased thirst.  Thought he may have a UTI so started taking Azo OTC.  Wife wanted him to come in for visit.     No blurred vision, no recent weight change.  Over the holidays has been eating a lot of sweets and drinking sugary drinks  Unfortunately was laid off in 03/2023.  Been sitting around the house not getting much activity  Fasting today.  Last meal last night.   No other aggravating or relieving factors.    No other c/o.  Past Medical History:  Diagnosis Date   Hypertension 2019   Obesity    Current Outpatient Medications on File Prior to Visit  Medication Sig Dispense Refill   amLODipine  (NORVASC ) 10 MG tablet TAKE 1 TABLET BY MOUTH EVERY DAY 90 tablet 0   promethazine  (PHENERGAN ) 12.5 MG suppository Place 1 suppository (12.5 mg total) rectally every 6 (six) hours as needed for nausea or vomiting. (Patient not taking: Reported on 05/05/2023) 12 each 0   No current facility-administered medications on file prior to visit.     The following portions of the patient's history were reviewed and updated as appropriate: allergies, current medications, past family history, past medical history, past social history, past surgical history and problem list.  ROS Otherwise as in subjective above   Objective: BP (!) 164/96   Pulse (!) 117   Wt 271 lb 6.4 oz (123.1 kg)   SpO2 99%   BMI 42.51 kg/m   General appearance: alert, no distress, well developed, well nourished, obese African American male Oral cavity: somewhat dry MM, no lesions Neck: supple, no  lymphadenopathy, no thyromegaly, no masses Heart: RRR, normal S1, S2, no murmurs Lungs: CTA bilaterally, no wheezes, rhonchi, or rales Pulses: 2+ radial pulses, 2+ pedal pulses, normal cap refill Ext: no edema    Assessment: Encounter Diagnoses  Name Primary?   New onset type 2 diabetes mellitus (HCC) Yes   Polydipsia    Polyuria    Essential hypertension, benign      Plan: We discussed new onset diabetes, toxicity with currently high sugars, potential risks/complications.   Discussed the following recommendations in detail, demonstrated proper use of insulin  and glucometer testing.   Call report in 3 days on Monday.  Recheck in person in 1-2 weeks.   He voiced understanding of instructions and plan.  I called [facility] and insulin  should be cash around $44.   Patient Instructions  Unfortunately your labs today show new onset diabetes.  Your blood sugars at a toxic level currently almost 400.  We need to get you out of the danger zone immediately.  Normal blood sugar would be between 70 and 100 fasting.  Years is almost 400 today.  Recommendations: Begin Novolin R fast acting insulin .  Today use 10 units of insulin  at lunch, then 4 units hourly for the next 3 hours.   Later this evening, use 8 units of insulin  at dinner. Starting tomorrow Saturday, use Novolin R, 8 units with meals.  Take this within  15 minutes of your meal. Over the next 3 to 4 days drink lots of water such as 1 glass of water per hour or at least 20 ounces per hour.  We need to help dilute the sugar in your system and help your body get out of the toxic zone Start checking your blood sugars with a glucose meter.  Check your sugar either in the morning and write this down fasting or check it before meals and write it down on a notebook and bring these numbers back with you. It will probably take a good 2 weeks before your sugars are down under 200 The goal is to get your sugars down around 170 or less over the next  month I do not suspect you will have to worry about low blood sugar or hypoglycemia currently but if you see any numbers 70 or below then drink some orange juice or some other fast acting source of sugar such as some candy, then recheck your blood sugars again in 15 minutes to make sure the numbers are coming up above 70 You need to make diet changes immediately Begin back on blood pressure medication.  I would have you start a medicine called losartan  HCT   I sent your medications to [facility] pharmacy on [address] phone number [phone].  Without insurance the only affordable insulin  is the generic insulin  at [facility].   Sample meal plan:  Breakfast You may eat 1 of the following Omelette, which can include a small amount of cheese, and vegetables such as peppers, mushrooms, small pieces of turkey or chicken Low sugar yogurt serving which can include some fruit such as berries Egg whites or hard boiled egg and meat (1-2 strips of bacon, or small piece of turkey sausage or turkey bacon)    Lunch A protein source such as 1 of the following: 1 serving of beans such as black beans, pinto beans, green beans, or edamame (soy beans) 1 meat serving such as 6 oz or deck of card size serving of fish, skinless chicken, or turkey, either grilled or baked preferably.   You can use some pork or beef, but limit this compared to fish, chicken or turkey Vegetable - Half of your plate should be a non-starchy vegetables!  So avoid white potatoes and corn.  Otherwise, eat a large portion of vegetables. Avocado, cucumber, tomato, carrots, greens, lettuce, squash, okra, etc.  Vegetables can include salad with olive oil/vinaigrette dressing   Mid-afternoon snack 1 fruit serving such as one of the following: medium-sized apple medium-sized orange, Tangerine 1/2 banana  3/4 cup of fresh berries or frozen berries  A protein source such as one of the following: 8 almonds  small handful of  walnuts or other nuts small piece of cheese, low sugar yogurt   Dinner A protein source such as 1 of the following: 1 serving of beans such as black beans, pinto beans, green beans, or edamame (soy beans) 1 meat serving such as 6 oz or deck of card size serving of fish, skinless chicken, or turkey, either grilled or baked preferably.   You can use some pork or beef, but limit this compared to fish, chicken or turkey Vegetable - Half of your plate should be a non-starchy vegetables!  So avoid white potatoes and corn.  Otherwise, eat a large portion of vegetables. Avocado, cucumber, tomato, carrots, greens, lettuce, squash, okra, etc.  Vegetables can include salad with olive oil/vinaigrette dressing   Beverages: Water Unsweet tea  Home made juice with a juicer without sugar added other than small bit of honey or agave nectar Water with sugar free flavor such as Mio   AVOID.... For the time being I want you to cut out the following items completely: Soda, sweet tea, juice, beer or wine or alcohol ALL grains and breads including rice, pasta, bread, cereal Sweets such as cake, candy, pies, chips, cookies, chocolate     Donis was seen today for urinary tract infection.  Diagnoses and all orders for this visit:  New onset type 2 diabetes mellitus (HCC)  Polydipsia  Polyuria  Essential hypertension, benign  Other orders -     insulin  regular (HUMULIN R ) 100 units/mL injection; Inject 0.08 mLs (8 Units total) into the skin 3 (three) times daily before meals. -     Insulin  Pen Needle (BD PEN NEEDLE NANO U/F) 32G X 4 MM MISC; 1 each by Does not apply route at bedtime. -     Blood Glucose Monitoring Suppl DEVI; 1 each by Does not apply route in the morning, at noon, and at bedtime. May substitute to any manufacturer covered by patient's insurance. -     Glucose Blood (BLOOD GLUCOSE TEST STRIPS) STRP; 1 each by In Vitro route in the morning, at noon, and at bedtime. May substitute  to any manufacturer covered by patient's insurance. -     Lancets Misc. MISC; 1 each by Does not apply route in the morning, at noon, and at bedtime. May substitute to any manufacturer covered by patient's insurance. -     losartan -hydrochlorothiazide (HYZAAR) 50-12.5 MG tablet; Take 1 tablet by mouth daily.   Follow up: 1-2 weeks

## 2023-05-05 NOTE — Patient Instructions (Addendum)
 Unfortunately your labs today show new onset diabetes.  Your blood sugars at a toxic level currently almost 400.  We need to get you out of the danger zone immediately.  Normal blood sugar would be between 70 and 100 fasting.  Years is almost 400 today.  Recommendations: Begin Novolin R fast acting insulin .  Today use 10 units of insulin  at lunch, then 4 units hourly for the next 3 hours.   Later this evening, use 8 units of insulin  at dinner. Starting tomorrow Saturday, use Novolin R, 8 units with meals.  Take this within 15 minutes of your meal. Over the next 3 to 4 days drink lots of water such as 1 glass of water per hour or at least 20 ounces per hour.  We need to help dilute the sugar in your system and help your body get out of the toxic zone Start checking your blood sugars with a glucose meter.  Check your sugar either in the morning and write this down fasting or check it before meals and write it down on a notebook and bring these numbers back with you. It will probably take a good 2 weeks before your sugars are down under 200 The goal is to get your sugars down around 170 or less over the next month I do not suspect you will have to worry about low blood sugar or hypoglycemia currently but if you see any numbers 70 or below then drink some orange juice or some other fast acting source of sugar such as some candy, then recheck your blood sugars again in 15 minutes to make sure the numbers are coming up above 70 You need to make diet changes immediately Begin back on blood pressure medication.  I would have you start a medicine called losartan  HCT   I sent your medications to [facility] pharmacy on [address] phone number [phone].  Without insurance the only affordable insulin  is the generic insulin  at [facility].   Sample meal plan:  Breakfast You may eat 1 of the following Omelette, which can include a small amount of cheese, and vegetables such as peppers, mushrooms,  small pieces of turkey or chicken Low sugar yogurt serving which can include some fruit such as berries Egg whites or hard boiled egg and meat (1-2 strips of bacon, or small piece of turkey sausage or turkey bacon)    Lunch A protein source such as 1 of the following: 1 serving of beans such as black beans, pinto beans, green beans, or edamame (soy beans) 1 meat serving such as 6 oz or deck of card size serving of fish, skinless chicken, or turkey, either grilled or baked preferably.   You can use some pork or beef, but limit this compared to fish, chicken or turkey Vegetable - Half of your plate should be a non-starchy vegetables!  So avoid white potatoes and corn.  Otherwise, eat a large portion of vegetables. Avocado, cucumber, tomato, carrots, greens, lettuce, squash, okra, etc.  Vegetables can include salad with olive oil/vinaigrette dressing   Mid-afternoon snack 1 fruit serving such as one of the following: medium-sized apple medium-sized orange, Tangerine 1/2 banana  3/4 cup of fresh berries or frozen berries  A protein source such as one of the following: 8 almonds  small handful of walnuts or other nuts small piece of cheese, low sugar yogurt   Dinner A protein source such as 1 of the following: 1 serving of beans such as black beans, pinto  beans, green beans, or edamame (soy beans) 1 meat serving such as 6 oz or deck of card size serving of fish, skinless chicken, or turkey, either grilled or baked preferably.   You can use some pork or beef, but limit this compared to fish, chicken or turkey Vegetable - Half of your plate should be a non-starchy vegetables!  So avoid white potatoes and corn.  Otherwise, eat a large portion of vegetables. Avocado, cucumber, tomato, carrots, greens, lettuce, squash, okra, etc.  Vegetables can include salad with olive oil/vinaigrette dressing   Beverages: Water Unsweet tea Home made juice with a juicer without sugar added other than  small bit of honey or agave nectar Water with sugar free flavor such as Mio   AVOID.... For the time being I want you to cut out the following items completely: Soda, sweet tea, juice, beer or wine or alcohol ALL grains and breads including rice, pasta, bread, cereal Sweets such as cake, candy, pies, chips, cookies, chocolate

## 2023-07-29 ENCOUNTER — Other Ambulatory Visit: Payer: Self-pay | Admitting: Medical

## 2023-10-01 ENCOUNTER — Other Ambulatory Visit: Payer: Self-pay | Admitting: Medical

## 2023-10-02 NOTE — Telephone Encounter (Signed)
 CPE made for 10/31/23

## 2023-10-02 NOTE — Telephone Encounter (Signed)
 Patient is due for a follow-up. Please schedule before I can refill medication

## 2023-10-02 NOTE — Telephone Encounter (Signed)
Refilled medication for 30 days. 

## 2023-10-30 ENCOUNTER — Other Ambulatory Visit: Payer: Self-pay | Admitting: Medical

## 2023-10-31 ENCOUNTER — Encounter: Payer: Self-pay | Admitting: Medical

## 2023-10-31 ENCOUNTER — Ambulatory Visit (INDEPENDENT_AMBULATORY_CARE_PROVIDER_SITE_OTHER): Payer: Self-pay | Admitting: Medical

## 2023-10-31 VITALS — BP 138/88 | HR 90 | Ht 69.0 in | Wt 291.8 lb

## 2023-10-31 DIAGNOSIS — R319 Hematuria, unspecified: Secondary | ICD-10-CM

## 2023-10-31 DIAGNOSIS — R3589 Other polyuria: Secondary | ICD-10-CM | POA: Diagnosis not present

## 2023-10-31 DIAGNOSIS — Z23 Encounter for immunization: Secondary | ICD-10-CM | POA: Diagnosis not present

## 2023-10-31 DIAGNOSIS — Z136 Encounter for screening for cardiovascular disorders: Secondary | ICD-10-CM | POA: Diagnosis not present

## 2023-10-31 DIAGNOSIS — Z125 Encounter for screening for malignant neoplasm of prostate: Secondary | ICD-10-CM | POA: Diagnosis not present

## 2023-10-31 DIAGNOSIS — Z7185 Encounter for immunization safety counseling: Secondary | ICD-10-CM

## 2023-10-31 DIAGNOSIS — I10 Essential (primary) hypertension: Secondary | ICD-10-CM | POA: Diagnosis not present

## 2023-10-31 DIAGNOSIS — Z1389 Encounter for screening for other disorder: Secondary | ICD-10-CM

## 2023-10-31 DIAGNOSIS — Z1211 Encounter for screening for malignant neoplasm of colon: Secondary | ICD-10-CM | POA: Diagnosis not present

## 2023-10-31 DIAGNOSIS — Z0000 Encounter for general adult medical examination without abnormal findings: Secondary | ICD-10-CM

## 2023-10-31 DIAGNOSIS — E1165 Type 2 diabetes mellitus with hyperglycemia: Secondary | ICD-10-CM

## 2023-10-31 LAB — POCT URINALYSIS DIP (PROADVANTAGE DEVICE)
Bilirubin, UA: NEGATIVE
Glucose, UA: NEGATIVE mg/dL
Ketones, POC UA: NEGATIVE mg/dL
Nitrite, UA: NEGATIVE
Protein Ur, POC: NEGATIVE mg/dL
Specific Gravity, Urine: 1.015
Urobilinogen, Ur: NEGATIVE
pH, UA: 6 (ref 5.0–8.0)

## 2023-10-31 NOTE — Patient Instructions (Signed)
 This visit was a preventative care visit, also known as wellness visit or routine physical.   Topics typically include healthy lifestyle, diet, exercise, preventative care, vaccinations, sick and well care, proper use of emergency dept and after hours care, as well as other concerns.     Separate significant issues discussed: Hypertension-pending labs he may need to modify his regimen given some cramps.  Currently on amlodipine  10 mg daily and losartan  HCT 50/12.5 mg daily  We discussed standard of care for diabetes for statin.  Not currently on statin.  Updated labs today.  Diabetes-pending labs we will likely make a modified regimen to try to get him off insulin .  Currently on Humulin insulin  8 units 3 times a day.  Continue glucose monitoring.  Discussed daily foot check.  Skin tag  -return at your convenience for excision  Polyuria, hematuria - resolved   General Recommendations: Continue to return yearly for your annual wellness and preventative care visits.  This gives us  a chance to discuss healthy lifestyle, exercise, vaccinations, review your chart record, and perform screenings where appropriate.  I recommend you see your eye doctor yearly for routine vision care.  I recommend you see your dentist yearly for routine dental care including hygiene visits twice yearly.   Vaccination  Immunization History  Administered Date(s) Administered   Pfizer(Comirnaty)Fall Seasonal Vaccine 12 years and older 07/13/2019, 08/03/2019, 05/26/2020    Vaccine recommendations: Flu, tdap, shingrix, pneumococcal, covid  Vaccines administered today: Counseled on the Tdap (tetanus, diptheria, and acellular pertussis) vaccine.  Vaccine information sheet given. Tdap vaccine given after consent obtained.    Screening for cancer: Colon cancer screening: We will try and get copy of last colonoscopy through [facility] from last 5-7 years  Prostate Cancer screening: The recommended prostate  cancer screening test is a blood test called the prostate-specific antigen (PSA) test. PSA is a protein that is made in the prostate. As you age, your prostate naturally produces more PSA. Abnormally high PSA levels may be caused by: Prostate cancer. An enlarged prostate that is not caused by cancer (benign prostatic hyperplasia, or BPH). This condition is very common in older men. A prostate gland infection (prostatitis) or urinary tract infection. Certain medicines such as male hormones (like testosterone) or other medicines that raise testosterone levels. A rectal exam may be done as part of prostate cancer screening to help provide information about the size of your prostate gland. When a rectal exam is performed, it should be done after the PSA level is drawn to avoid any effect on the results.   Skin cancer screening: Check your skin regularly for new changes, growing lesions, or other lesions of concern Come in for evaluation if you have skin lesions of concern.   Lung cancer screening: If you have a greater than 20 pack year history of tobacco use, then you may qualify for lung cancer screening with a chest CT scan.   Please call your insurance company to inquire about coverage for this test.   Pancreatic cancer:  no current screening test is available or routinely recommended. (risk factors: smoking, overweight or obese, diabetes, chronic pancreatitis, work exposure - dry cleaning, metal working, 63yo>, M>F, Tree surgeon, family hx/o, hereditary breast, ovarian, melanoma, lynch, peutz-jeghers).  Symptoms: jaundice, dark urine, light color or greasy stools, itchy skin, belly or back pain, weight loss, poor appetite, nausea, vomiting, liver enlargement, DVT/blood clots.   We currently don't have screenings for other cancers besides breast, cervical, colon, and lung cancers.  If you have a strong family history of cancer or have other cancer screening concerns, please let me know.   Genetic testing referral is an option for individuals with high cancer risk in the family.  There are some other cancer screenings in development currently.   Bone health: Get at least 150 minutes of aerobic exercise weekly Get weight bearing exercise at least once weekly Bone density test:  A bone density test is an imaging test that uses a type of X-ray to measure the amount of calcium and other minerals in your bones. The test may be used to diagnose or screen you for a condition that causes weak or thin bones (osteoporosis), predict your risk for a broken bone (fracture), or determine how well your osteoporosis treatment is working. The bone density test is recommended for females 65 and older, or females or males <65 if certain risk factors such as thyroid disease, long term use of steroids such as for asthma or rheumatological issues, vitamin D deficiency, estrogen deficiency, family history of osteoporosis, self or family history of fragility fracture in first degree relative.    Heart health: Get at least 150 minutes of aerobic exercise weekly Limit alcohol It is important to maintain a healthy blood pressure and healthy cholesterol numbers  Heart disease screening: Screening for heart disease includes screening for blood pressure, fasting lipids, glucose/diabetes screening, BMI height to weight ratio, reviewed of smoking status, physical activity, and diet.    Goals include blood pressure 120/80 or less, maintaining a healthy lipid/cholesterol profile, preventing diabetes or keeping diabetes numbers under good control, not smoking or using tobacco products, exercising most days per week or at least 150 minutes per week of exercise, and eating healthy variety of fruits and vegetables, healthy oils, and avoiding unhealthy food choices like fried food, fast food, high sugar and high cholesterol foods.    Other tests may possibly include EKG test, CT coronary calcium score,  echocardiogram, exercise treadmill stress test.     Discussed CT coronary test, baseline EKG.   Consider CT coronary test for $99 cash pay.   Vascular disease screening: For higher risk individuals including smokers, diabetics, patients with known heart disease or high blood pressure, kidney disease, and others, screening for vascular disease or atherosclerosis of the arteries is available.  Examples may include carotid ultrasound, abdominal aortic ultrasound, ABI blood flow screening in the legs, thoracic aorta screening.   Medical care options: I recommend you continue to seek care here first for routine care.  We try really hard to have available appointments Monday through Friday daytime hours for sick visits, acute visits, and physicals.  Urgent care should be used for after hours and weekends for significant issues that cannot wait till the next day.  The emergency department should be used for significant potentially life-threatening emergencies.  The emergency department is expensive, can often have long wait times for less significant concerns, so try to utilize primary care, urgent care, or telemedicine when possible to avoid unnecessary trips to the emergency department.  Virtual visits and telemedicine have been introduced since the pandemic started in 2020, and can be convenient ways to receive medical care.  We offer virtual appointments as well to assist you in a variety of options to seek medical care.   Legal  Take the time to do a last will and testament, Advanced Directives including Health Care Power of Attorney and Living Will documents.  Don't leave your family with burdens that can be handled  ahead of time.   Advanced Directives: I recommend you consider completing a Health Care Power of Attorney and Living Will.   These documents respect your wishes and help alleviate burdens on your loved ones if you were to become terminally ill or be in a position to need those documents  enforced.    You can complete Advanced Directives yourself, have them notarized, then have copies made for our office, for you and for anybody you feel should have them in safe keeping.  Or, you can have an attorney prepare these documents.   If you haven't updated your Last Will and Testament in a while, it may be worthwhile having an attorney prepare these documents together and save on some costs.       Spiritual and Emotional Health Keeping a healthy spiritual life can help you better manage your physical health. Your spiritual life can help you to cope with any issues that may arise with your physical health.  Balance can keep us  healthy and help us  to recover.  If you are struggling with your spiritual health there are questions that you may want to ask yourself:  What makes me feel most complete? When do I feel most connected to the rest of the world? Where do I find the most inner strength? What am I doing when I feel whole?  Helpful tips: Being in nature. Some people feel very connected and at peace when they are walking outdoors or are outside. Helping others. Some feel the largest sense of wellbeing when they are of service to others. Being of service can take on many forms. It can be doing volunteer work, being kind to strangers, or offering a hand to a friend in need. Gratitude. Some people find they feel the most connected when they remain grateful. They may make lists of all the things they are grateful for or say a thank you out loud for all they have.    Emotional Health Are you in tune with your emotional health?  Check out this link: http://www.marquez-love.com/    Financial Health Make sure you use a budget for your personal finances Make sure you are insured against risks (health insurance, life insurance, auto insurance, etc) Save more, spend less Set financial goals If you need help in this area, good resources include counseling through [employer]  or other community resources, have a meeting with a Social research officer, government, and a good resource is the [employer]

## 2023-10-31 NOTE — Progress Notes (Signed)
Documented vaccines.

## 2023-10-31 NOTE — Addendum Note (Signed)
 Addended by: VICCI HUSBAND A on: 10/31/2023 04:06 PM   Modules accepted: Orders

## 2023-10-31 NOTE — Progress Notes (Signed)
 Subjective:   HPI  Daryl Clark is a 63 y.o. male who presents for Chief Complaint  Patient presents with   Annual Exam    Fasting cpe, follow-up on UTI . Does not want to take losartan -hydrochlorothiazide anymore and would like to go back on amlodipine  10mg . Pt has not seen an eye doctor    Patient Care Team: Lorraina Spring, Alm GORMAN RIGGERS as PCP - General (Family Medicine) No current dentist or eye doctor   Concerns: Here for well visit fasting.  I last saw him in January 2025.  He says he tried to get in sooner but there was no appointments available  New onset diabetes January 2025.  He has been using mealtime insulin  8 units 3 times a day.  Lately glucose is running good between 80-108 fasting.  He thinks the insulin  has caused some weight gain.  He gets cramps on the current blood pressure regimen.  He is taking the amlodipine  10 mg daily and losartan  HCT 50/12.5 mg daily.  He is not checking blood pressures at home.  Last visit there was some concern for urinary tract infection.  No current symptoms of UTI  Reviewed their medical, surgical, family, social, medication, and allergy history and updated chart as appropriate.  Allergies  Allergen Reactions   Losartan  Potassium-Hctz Other (See Comments)    Cramps a lot     Past Medical History:  Diagnosis Date   Diabetes (HCC)    Hypertension 2019   Obesity      Current Outpatient Medications:    amLODipine  (NORVASC ) 10 MG tablet, TAKE 1 TABLET BY MOUTH EVERY DAY, Disp: 90 tablet, Rfl: 0   insulin  regular (HUMULIN R ) 100 units/mL injection, Inject 0.08 mLs (8 Units total) into the skin 3 (three) times daily before meals., Disp: 10 mL, Rfl: 2   losartan -hydrochlorothiazide (HYZAAR) 50-12.5 MG tablet, Take 1 tablet by mouth once daily, Disp: 30 tablet, Rfl: 0   Blood Glucose Monitoring Suppl DEVI, 1 each by Does not apply route in the morning, at noon, and at bedtime. May substitute to any manufacturer covered by patient's  insurance., Disp: 1 each, Rfl: 0   Insulin  Pen Needle (BD PEN NEEDLE NANO U/F) 32G X 4 MM MISC, 1 each by Does not apply route at bedtime., Disp: 100 each, Rfl: 1   promethazine  (PHENERGAN ) 12.5 MG suppository, Place 1 suppository (12.5 mg total) rectally every 6 (six) hours as needed for nausea or vomiting. (Patient not taking: Reported on 05/05/2023), Disp: 12 each, Rfl: 0  Family History  Problem Relation Age of Onset   Cirrhosis Mother    Other Father        died of natural causes   Heart disease Neg Hx    Cancer Neg Hx    Diabetes Neg Hx     Past Surgical History:  Procedure Laterality Date   ANKLE SURGERY Left    COLONOSCOPY  2020   [facility]   LEG SURGERY Right      Review of Systems  Constitutional:  Negative for chills, fever, malaise/fatigue and weight loss.  HENT:  Negative for congestion, ear pain, hearing loss, sore throat and tinnitus.   Eyes:  Negative for blurred vision, pain and redness.  Respiratory:  Negative for cough, hemoptysis and shortness of breath.   Cardiovascular:  Negative for chest pain, palpitations, orthopnea, claudication and leg swelling.  Gastrointestinal:  Negative for abdominal pain, blood in stool, constipation, diarrhea, nausea and vomiting.  Genitourinary:  Negative for  dysuria, flank pain, frequency, hematuria and urgency.  Musculoskeletal:  Negative for falls, joint pain and myalgias.  Skin:  Negative for itching and rash.  Neurological:  Negative for dizziness, tingling, speech change, weakness and headaches.  Endo/Heme/Allergies:  Negative for polydipsia. Does not bruise/bleed easily.  Psychiatric/Behavioral:  Negative for depression and memory loss. The patient is not nervous/anxious and does not have insomnia.         Objective:  BP 138/88   Pulse 90   Ht 5' 9 (1.753 m)   Wt 291 lb 12.8 oz (132.4 kg)   SpO2 98%   BMI 43.09 kg/m   Wt Readings from Last 3 Encounters:  10/31/23 291 lb 12.8 oz (132.4 kg)  05/05/23 271 lb 6.4  oz (123.1 kg)  12/09/20 280 lb (127 kg)   BP Readings from Last 3 Encounters:  10/31/23 138/88  05/05/23 (!) 164/96  12/09/20 132/87    General appearance: alert, no distress, WD/WN, African American male Skin: Left posterior thigh just below the buttocks with a large pedunculated skin tag.  The base is approximately 1.4 cm long but the mass itself is approximately 6 cm diameter fleshy skin tag.  No other worrisome lesions HEENT: normocephalic, conjunctiva/corneas normal, sclerae anicteric, PERRLA, EOMi, nares patent, no discharge or erythema, pharynx normal Oral cavity: MMM, tongue normal, teeth normal Neck: supple, no lymphadenopathy, no thyromegaly, no masses, normal ROM, no bruits Chest: non tender, normal shape and expansion Heart: RRR, normal S1, S2, no murmurs Lungs: CTA bilaterally, no wheezes, rhonchi, or rales Abdomen: +bs, soft, non tender, non distended, no masses, no hepatomegaly, no splenomegaly, no bruits Back: non tender, normal ROM, no scoliosis Musculoskeletal: upper extremities non tender, no obvious deformity, normal ROM throughout, lower extremities non tender, no obvious deformity, normal ROM throughout Extremities: no edema, no cyanosis, no clubbing Pulses: 2+ symmetric, upper and lower extremities, normal cap refill Neurological: alert, oriented x 3, CN2-12 intact, strength normal upper extremities and lower extremities, sensation normal throughout, DTRs 2+ throughout, no cerebellar signs, gait normal Psychiatric: normal affect, behavior normal, pleasant  GU: normal male external genitalia,uncircumcised, nontender, no masses, no hernia, no lymphadenopathy Rectal: Anus normal tone, prostate mild to moderate enlargement, no nodules.  EKG reviewed   Assessment and Plan :   Encounter Diagnoses  Name Primary?   Encounter for health maintenance examination in adult Yes   Screening for colon cancer    Screening for prostate cancer    Vaccine counseling     Screening for hematuria or proteinuria    Hematuria, unspecified type    Polyuria    Type 2 diabetes mellitus with hyperglycemia, unspecified whether long term insulin  use (HCC)    Essential hypertension, benign    Screening for heart disease    Need for Tdap vaccination     This visit was a preventative care visit, also known as wellness visit or routine physical.   Topics typically include healthy lifestyle, diet, exercise, preventative care, vaccinations, sick and well care, proper use of emergency dept and after hours care, as well as other concerns.     Separate significant issues discussed: Hypertension-pending labs he may need to modify his regimen given some cramps.  Currently on amlodipine  10 mg daily and losartan  HCT 50/12.5 mg daily  We discussed standard of care for diabetes for statin.  Not currently on statin.  Updated labs today.  Diabetes-pending labs we will likely make a modified regimen to try to get him off insulin .  Currently on Humulin  insulin  8 units 3 times a day.  Continue glucose monitoring.  Discussed daily foot check.  Skin tag  -return at your convenience for excision  Polyuria, hematuria - resolved   General Recommendations: Continue to return yearly for your annual wellness and preventative care visits.  This gives us  a chance to discuss healthy lifestyle, exercise, vaccinations, review your chart record, and perform screenings where appropriate.  I recommend you see your eye doctor yearly for routine vision care.  I recommend you see your dentist yearly for routine dental care including hygiene visits twice yearly.   Vaccination  Immunization History  Administered Date(s) Administered   Pfizer(Comirnaty)Fall Seasonal Vaccine 12 years and older 07/13/2019, 08/03/2019, 05/26/2020    Vaccine recommendations: Flu, tdap, shingrix, pneumococcal, covid  Vaccines administered today: Counseled on the Tdap (tetanus, diptheria, and acellular pertussis)  vaccine.  Vaccine information sheet given. Tdap vaccine given after consent obtained.    Screening for cancer: Colon cancer screening: We will try and get copy of last colonoscopy through [facility] from last 5-7 years  Prostate Cancer screening: The recommended prostate cancer screening test is a blood test called the prostate-specific antigen (PSA) test. PSA is a protein that is made in the prostate. As you age, your prostate naturally produces more PSA. Abnormally high PSA levels may be caused by: Prostate cancer. An enlarged prostate that is not caused by cancer (benign prostatic hyperplasia, or BPH). This condition is very common in older men. A prostate gland infection (prostatitis) or urinary tract infection. Certain medicines such as male hormones (like testosterone) or other medicines that raise testosterone levels. A rectal exam may be done as part of prostate cancer screening to help provide information about the size of your prostate gland. When a rectal exam is performed, it should be done after the PSA level is drawn to avoid any effect on the results.   Skin cancer screening: Check your skin regularly for new changes, growing lesions, or other lesions of concern Come in for evaluation if you have skin lesions of concern.   Lung cancer screening: If you have a greater than 20 pack year history of tobacco use, then you may qualify for lung cancer screening with a chest CT scan.   Please call your insurance company to inquire about coverage for this test.   Pancreatic cancer:  no current screening test is available or routinely recommended. (risk factors: smoking, overweight or obese, diabetes, chronic pancreatitis, work exposure - dry cleaning, metal working, 63yo>, M>F, Tree surgeon, family hx/o, hereditary breast, ovarian, melanoma, lynch, peutz-jeghers).  Symptoms: jaundice, dark urine, light color or greasy stools, itchy skin, belly or back pain, weight loss, poor  appetite, nausea, vomiting, liver enlargement, DVT/blood clots.   We currently don't have screenings for other cancers besides breast, cervical, colon, and lung cancers.  If you have a strong family history of cancer or have other cancer screening concerns, please let me know.  Genetic testing referral is an option for individuals with high cancer risk in the family.  There are some other cancer screenings in development currently.   Bone health: Get at least 150 minutes of aerobic exercise weekly Get weight bearing exercise at least once weekly Bone density test:  A bone density test is an imaging test that uses a type of X-ray to measure the amount of calcium and other minerals in your bones. The test may be used to diagnose or screen you for a condition that causes weak or thin bones (osteoporosis),  predict your risk for a broken bone (fracture), or determine how well your osteoporosis treatment is working. The bone density test is recommended for females 65 and older, or females or males <65 if certain risk factors such as thyroid disease, long term use of steroids such as for asthma or rheumatological issues, vitamin D deficiency, estrogen deficiency, family history of osteoporosis, self or family history of fragility fracture in first degree relative.    Heart health: Get at least 150 minutes of aerobic exercise weekly Limit alcohol It is important to maintain a healthy blood pressure and healthy cholesterol numbers  Heart disease screening: Screening for heart disease includes screening for blood pressure, fasting lipids, glucose/diabetes screening, BMI height to weight ratio, reviewed of smoking status, physical activity, and diet.    Goals include blood pressure 120/80 or less, maintaining a healthy lipid/cholesterol profile, preventing diabetes or keeping diabetes numbers under good control, not smoking or using tobacco products, exercising most days per week or at least 150 minutes  per week of exercise, and eating healthy variety of fruits and vegetables, healthy oils, and avoiding unhealthy food choices like fried food, fast food, high sugar and high cholesterol foods.    Other tests may possibly include EKG test, CT coronary calcium score, echocardiogram, exercise treadmill stress test.     Discussed CT coronary test, baseline EKG.   Consider CT coronary test for $99 cash pay.   Vascular disease screening: For higher risk individuals including smokers, diabetics, patients with known heart disease or high blood pressure, kidney disease, and others, screening for vascular disease or atherosclerosis of the arteries is available.  Examples may include carotid ultrasound, abdominal aortic ultrasound, ABI blood flow screening in the legs, thoracic aorta screening.   Medical care options: I recommend you continue to seek care here first for routine care.  We try really hard to have available appointments Monday through Friday daytime hours for sick visits, acute visits, and physicals.  Urgent care should be used for after hours and weekends for significant issues that cannot wait till the next day.  The emergency department should be used for significant potentially life-threatening emergencies.  The emergency department is expensive, can often have long wait times for less significant concerns, so try to utilize primary care, urgent care, or telemedicine when possible to avoid unnecessary trips to the emergency department.  Virtual visits and telemedicine have been introduced since the pandemic started in 2020, and can be convenient ways to receive medical care.  We offer virtual appointments as well to assist you in a variety of options to seek medical care.   Legal  Take the time to do a last will and testament, Advanced Directives including Health Care Power of Attorney and Living Will documents.  Don't leave your family with burdens that can be handled ahead of  time.   Advanced Directives: I recommend you consider completing a Health Care Power of Attorney and Living Will.   These documents respect your wishes and help alleviate burdens on your loved ones if you were to become terminally ill or be in a position to need those documents enforced.    You can complete Advanced Directives yourself, have them notarized, then have copies made for our office, for you and for anybody you feel should have them in safe keeping.  Or, you can have an attorney prepare these documents.   If you haven't updated your Last Will and Testament in a while, it may be worthwhile having an attorney  prepare these documents together and save on some costs.       Spiritual and Emotional Health Keeping a healthy spiritual life can help you better manage your physical health. Your spiritual life can help you to cope with any issues that may arise with your physical health.  Balance can keep us  healthy and help us  to recover.  If you are struggling with your spiritual health there are questions that you may want to ask yourself:  What makes me feel most complete? When do I feel most connected to the rest of the world? Where do I find the most inner strength? What am I doing when I feel whole?  Helpful tips: Being in nature. Some people feel very connected and at peace when they are walking outdoors or are outside. Helping others. Some feel the largest sense of wellbeing when they are of service to others. Being of service can take on many forms. It can be doing volunteer work, being kind to strangers, or offering a hand to a friend in need. Gratitude. Some people find they feel the most connected when they remain grateful. They may make lists of all the things they are grateful for or say a thank you out loud for all they have.    Emotional Health Are you in tune with your emotional health?  Check out this link: [email or web address]://[email or web address]-[email or web address]/    Financial  Health Make sure you use a budget for your personal finances Make sure you are insured against risks (health insurance, life insurance, auto insurance, etc) Save more, spend less Set financial goals If you need help in this area, good resources include counseling through [employer] or other community resources, have a meeting with a Social research officer, government, and a good resource is the [employer] was seen today for annual exam.  Diagnoses and all orders for this visit:  Encounter for health maintenance examination in adult -     Comprehensive metabolic panel with GFR -     CBC -     Lipid panel -     T4, free -     TSH -     PSA -     Hemoglobin A1c -     EKG 12-Lead -     Microalbumin/Creatinine Ratio, Urine  Screening for colon cancer  Screening for prostate cancer -     PSA  Vaccine counseling  Screening for hematuria or proteinuria  Hematuria, unspecified type -     POCT Urinalysis DIP (Proadvantage Device)  Polyuria -     POCT Urinalysis DIP (Proadvantage Device)  Type 2 diabetes mellitus with hyperglycemia, unspecified whether long term insulin  use (HCC) -     Hemoglobin A1c -     Microalbumin/Creatinine Ratio, Urine  Essential hypertension, benign  Screening for heart disease -     EKG 12-Lead  Need for Tdap vaccination    Follow-up pending labs, yearly for physical

## 2023-11-01 ENCOUNTER — Ambulatory Visit: Payer: Self-pay | Admitting: Medical

## 2023-11-01 LAB — COMPREHENSIVE METABOLIC PANEL WITH GFR
ALT: 30 IU/L (ref 0–44)
AST: 35 IU/L (ref 0–40)
Albumin: 4.7 g/dL (ref 3.9–4.9)
Alkaline Phosphatase: 53 IU/L (ref 44–121)
BUN/Creatinine Ratio: 11 (ref 10–24)
BUN: 12 mg/dL (ref 8–27)
Bilirubin Total: 0.4 mg/dL (ref 0.0–1.2)
CO2: 20 mmol/L (ref 20–29)
Calcium: 9.4 mg/dL (ref 8.6–10.2)
Chloride: 102 mmol/L (ref 96–106)
Creatinine, Ser: 1.13 mg/dL (ref 0.76–1.27)
Globulin, Total: 2.5 g/dL (ref 1.5–4.5)
Glucose: 81 mg/dL (ref 70–99)
Potassium: 4 mmol/L (ref 3.5–5.2)
Sodium: 137 mmol/L (ref 134–144)
Total Protein: 7.2 g/dL (ref 6.0–8.5)
eGFR: 73 mL/min/{1.73_m2} (ref >59)

## 2023-11-01 LAB — LIPID PANEL
Chol/HDL Ratio: 4 ratio (ref 0.0–5.0)
Cholesterol, Total: 196 mg/dL (ref 100–199)
HDL: 49 mg/dL (ref >39)
LDL Chol Calc (NIH): 126 mg/dL — ABNORMAL HIGH (ref 0–99)
Triglycerides: 115 mg/dL (ref 0–149)
VLDL Cholesterol Cal: 21 mg/dL (ref 5–40)

## 2023-11-01 LAB — CBC
Hematocrit: 43.4 % (ref 37.5–51.0)
Hemoglobin: 13.8 g/dL (ref 13.0–17.7)
MCH: 27.1 pg (ref 26.6–33.0)
MCHC: 31.8 g/dL (ref 31.5–35.7)
MCV: 85 fL (ref 79–97)
Platelets: 292 10*3/uL (ref 150–450)
RBC: 5.09 x10E6/uL (ref 4.14–5.80)
RDW: 13.8 % (ref 11.6–15.4)
WBC: 6 10*3/uL (ref 3.4–10.8)

## 2023-11-01 LAB — T4, FREE: Free T4: 1.04 ng/dL (ref 0.82–1.77)

## 2023-11-01 LAB — HEMOGLOBIN A1C
Est. average glucose Bld gHb Est-mCnc: 151 mg/dL
Hgb A1c MFr Bld: 6.9 % — ABNORMAL HIGH (ref 4.8–5.6)

## 2023-11-01 LAB — TSH: TSH: 3.17 u[IU]/mL (ref 0.450–4.500)

## 2023-11-01 LAB — MICROALBUMIN / CREATININE URINE RATIO
Creatinine, Urine: 108.1 mg/dL
Microalb/Creat Ratio: 17 mg/g{creat} (ref 0–29)
Microalbumin, Urine: 18.2 ug/mL

## 2023-11-01 LAB — PSA: Prostate Specific Ag, Serum: 5.5 ng/mL — ABNORMAL HIGH (ref 0.0–4.0)

## 2023-11-02 ENCOUNTER — Other Ambulatory Visit: Payer: Self-pay | Admitting: Medical

## 2023-11-02 ENCOUNTER — Other Ambulatory Visit (HOSPITAL_COMMUNITY): Payer: Self-pay

## 2023-11-02 ENCOUNTER — Telehealth: Payer: Self-pay

## 2023-11-02 MED ORDER — AMLODIPINE BESYLATE 10 MG PO TABS: 10.0000 mg | ORAL_TABLET | Freq: Every day | ORAL | 1 refills | Status: DC

## 2023-11-02 MED ORDER — MOUNJARO 5 MG/0.5ML ~~LOC~~ SOAJ: 5.0000 mg | SUBCUTANEOUS | 1 refills | Status: DC

## 2023-11-02 MED ORDER — VALSARTAN 80 MG PO TABS: 80.0000 mg | ORAL_TABLET | Freq: Every day | ORAL | 2 refills | Status: DC

## 2023-11-02 MED ORDER — MOUNJARO 2.5 MG/0.5ML ~~LOC~~ SOAJ: 2.5000 mg | SUBCUTANEOUS | 0 refills | Status: DC

## 2023-11-02 NOTE — Telephone Encounter (Signed)
 Pharmacy Patient Advocate Encounter   Received notification from CoverMyMeds that prior authorization for Mounjaro 2.5MG /0.5ML auto-injectors is required/requested.   Insurance verification completed.   The patient is insured through [employer] .   Per test claim: PA required; PA submitted to above mentioned insurance via CoverMyMeds Key/confirmation #/EOC (Key: [id number])   Status is pending

## 2023-11-02 NOTE — Progress Notes (Signed)
 LDL cholesterol is elevated putting you at risk for heart disease and stroke  Prostate marker was actually elevated little bit.  Have you ever had an elevated prostate marker in the past?  Diabetes marker improved at 6.9%.  Liver kidney and electrolyte blood test normal.  Blood counts normal.  Thyroid normal.  Microalbumin kidney marker normal.  Recommendations: Are you currently taking amlodipine  10 mg daily?  I am going to change to losartan  HCT given your cramps.  I want to change you to a plain valsartan instead of losartan  HCTZ assuming insurance will pay for this  Lets begin either Ozempic or Mounjaro which ever 1 your insurance will pay for.  This is a weekly injection to help with diabetes control and weight loss.  Side effects can include nausea, indigestion or constipation.  Most people tolerate this well.  There are several doses so we have to taper up slowly.  As you begin the weekly shot of Ozempic or Mounjaro, I will have you cut down to 3 units of the insulin  instead of 8 units, with meals 2-3 times a day.  But monitor your blood sugars.  If you see any readings that start to approach 80 or less then we would just go ahead and stop the insulin  for now.  Let me know if either 1 of these medications is not approved by insurance  I will send 2 doses of the weekly shot to the pharmacy.  Start the lowest dose first for 1 month then go up to the next dose for the second month  Lets see you back in about 1 month to recheck on this and recheck your prostate marker  Make sure he has a way to monitor his blood sugars

## 2023-11-06 ENCOUNTER — Other Ambulatory Visit (HOSPITAL_COMMUNITY): Payer: Self-pay

## 2023-11-08 ENCOUNTER — Other Ambulatory Visit (HOSPITAL_COMMUNITY): Payer: Self-pay

## 2023-11-08 NOTE — Telephone Encounter (Signed)
 Update:   I have called the plan as of 7/9 to inquire about a dtermination and ask that it is expedited being the pts diagnosis. The request has been expedited and now has a turn around time of 24-72hrs  ReferenceBETHA Rue Case# [id number] Contact: [phone]

## 2023-11-09 ENCOUNTER — Other Ambulatory Visit (HOSPITAL_COMMUNITY): Payer: Self-pay

## 2023-11-09 NOTE — Telephone Encounter (Signed)
 Pharmacy Patient Advocate Encounter  Received notification from [facility] that Prior Authorization for Mounjaro  2.5MG /0.5ML auto-injectors has been APPROVED from 6.3.25 to 7.9.26. Ran test claim, Copay is $RX HAS FILLED AS OF 7.9.25. This test claim was processed through [facility]- copay amounts may vary at other pharmacies due to pharmacy/plan contracts, or as the patient moves through the different stages of their insurance plan.   PA #/Case ID/Reference #: (Key: [id number])

## 2024-03-25 ENCOUNTER — Encounter: Payer: Self-pay | Admitting: Medical

## 2024-03-25 ENCOUNTER — Ambulatory Visit (INDEPENDENT_AMBULATORY_CARE_PROVIDER_SITE_OTHER): Payer: Self-pay | Admitting: Medical

## 2024-03-25 VITALS — BP 146/88 | HR 78 | Ht 69.0 in | Wt 284.4 lb

## 2024-03-25 DIAGNOSIS — E1159 Type 2 diabetes mellitus with other circulatory complications: Secondary | ICD-10-CM | POA: Diagnosis not present

## 2024-03-25 DIAGNOSIS — Z2820 Immunization not carried out because of patient decision for unspecified reason: Secondary | ICD-10-CM | POA: Insufficient documentation

## 2024-03-25 DIAGNOSIS — E118 Type 2 diabetes mellitus with unspecified complications: Secondary | ICD-10-CM | POA: Insufficient documentation

## 2024-03-25 DIAGNOSIS — Z1211 Encounter for screening for malignant neoplasm of colon: Secondary | ICD-10-CM | POA: Insufficient documentation

## 2024-03-25 DIAGNOSIS — Z794 Long term (current) use of insulin: Secondary | ICD-10-CM

## 2024-03-25 DIAGNOSIS — R972 Elevated prostate specific antigen [PSA]: Secondary | ICD-10-CM | POA: Insufficient documentation

## 2024-03-25 DIAGNOSIS — I152 Hypertension secondary to endocrine disorders: Secondary | ICD-10-CM

## 2024-03-25 MED ORDER — INSULIN NPH (HUMAN) (ISOPHANE) 100 UNIT/ML ~~LOC~~ SUSP: 8.0000 [IU] | Freq: Every day | SUBCUTANEOUS | 1 refills | Status: AC

## 2024-03-25 MED ORDER — MOUNJARO 2.5 MG/0.5ML ~~LOC~~ SOAJ: 2.5000 mg | SUBCUTANEOUS | 0 refills | Status: DC

## 2024-03-25 MED ORDER — AMLODIPINE-OLMESARTAN 10-20 MG PO TABS: 1.0000 | ORAL_TABLET | Freq: Every day | ORAL | 2 refills | Status: DC

## 2024-03-25 MED ORDER — ROSUVASTATIN CALCIUM 10 MG PO TABS: 10.0000 mg | ORAL_TABLET | Freq: Every day | ORAL | 2 refills | Status: AC

## 2024-03-25 NOTE — Patient Instructions (Addendum)
 Type 2 diabetes mellitus with unspecified complications Blood sugars well-controlled, A1c 6.9%. - Prescribed Mounjaro  for weight loss and diabetes control, pending insurance coverage. - Instructed to reduce insulin  to 5 units with meals upon starting Mounjaro . -See your eye doctor yearly for diabetic eye exam  Essential hypertension Blood pressure high, previously on amlodipine  and losartan  HCT, now only on amlodipine  at max dose. - Prescribed Exforge (amlodipine /olmesartan ) for blood pressure control. - Refilled amlodipine  prescription.  Morbid obesity due to excess calories Discussed Mounjaro  for weight loss with diabetes management. - Prescribed Mounjaro  for weight loss and diabetes control, pending insurance coverage.  Elevated prostate specific antigen (PSA) PSA elevated at 5.5 in July, discussed potential causes. - Rechecked PSA today. -PSA can be elevated with enlarged prostate, prostatitis, prostate cancer or other  Vaccine refused -Recommend multiple vaccines today  Screening for colon cancer -We will try request prior colon cancer

## 2024-03-25 NOTE — Progress Notes (Signed)
 Subjective:  Daryl Clark is a 63 y.o. male who presents for Chief Complaint  Patient presents with   other    Doesn't take Mounjaro - Amlodipine  he's saying that he is taking- Not taking Valsartan  or losartan . He uses [facility] and these prescriptions were sent to [facility]. Uses Humulin Insulin , he's not sure of dose      Daryl Clark is a 63 year old male with hypertension and diabetes who presents for medication management and follow-up.  He is currently taking amlodipine  10 mg daily and Humulin insulin  8 units three times a day. His blood sugars are well-controlled, averaging between 95 to 117 mg/dL. He previously ran out of losartan  HCT around October and is not currently on any cholesterol medication. He has never been on cholesterol medication before.  I have last visit in July 2025 the plan was to add Mounjaro  and to stop losartan  HCT and changed to plain valsartan  for better blood pressure control and to reduce cramps he would likely getting from hydrochlorothiazide.  Apparently the medicines last visit were sent to pharmacy different than what he intended.  He never picked them up  His last cholesterol check four months ago showed an LDL of 126 mg/dL. He does not recall his last colon cancer screening but believes it was a Cologuard test done within the last three years. His last PSA test in July showed a level of 5.5 ng/mL. He has never had an elevated PSA before. No changes in urination or nocturia.  His last A1c was 6.9%. He walks regularly for exercise. He has not received vaccines other than tetanus and COVID, and he does not have a recent colon cancer screening on file.  No other aggravating or relieving factors.    No other c/o.  Past Medical History:  Diagnosis Date   Diabetes (HCC)    Hypertension 2019   Obesity    Current Outpatient Medications on File Prior to Visit  Medication Sig Dispense Refill   Blood Glucose Monitoring Suppl DEVI 1 each by Does not  apply route in the morning, at noon, and at bedtime. May substitute to any manufacturer covered by patient's insurance. 1 each 0   Insulin  Pen Needle (BD PEN NEEDLE NANO U/F) 32G X 4 MM MISC 1 each by Does not apply route at bedtime. 100 each 1   No current facility-administered medications on file prior to visit.     The following portions of the patient's history were reviewed and updated as appropriate: allergies, current medications, past family history, past medical history, past social history, past surgical history and problem list.  ROS Otherwise as in subjective above    Objective: BP (!) 152/96   Pulse 78   Ht 5' 9 (1.753 m)   Wt 284 lb 6.4 oz (129 kg)   SpO2 98%   BMI 42.00 kg/m  Wt Readings from Last 3 Encounters:  03/25/24 284 lb 6.4 oz (129 kg)  10/31/23 291 lb 12.8 oz (132.4 kg)  05/05/23 271 lb 6.4 oz (123.1 kg)   BP Readings from Last 3 Encounters:  03/25/24 (!) 152/96  10/31/23 138/88  05/05/23 (!) 164/96    General appearance: alert, no distress, well developed, well nourished Neck: supple, no lymphadenopathy, no thyromegaly, no masses Heart: RRR, normal S1, S2, no murmurs Lungs: CTA bilaterally, no wheezes, rhonchi, or rales Pulses: 2+ radial pulses, 2+ pedal pulses, normal cap refill Ext: no edema    Assessment: Encounter Diagnoses  Name Primary?  DM (diabetes mellitus), type 2 with complications (HCC) Yes   Hypertension associated with diabetes (HCC)    Elevated PSA    Morbid obesity (HCC)      Plan: Type 2 diabetes mellitus with unspecified complications Blood sugars well-controlled, A1c 6.9%. - Prescribed Mounjaro  for weight loss and diabetes control, pending insurance coverage. - Instructed to reduce insulin  to 5 units with meals upon starting Mounjaro . - See your eye doctor yearly for diabetic eye exam  Essential hypertension Blood pressure high, previously on amlodipine  and losartan  HCT, now only on amlodipine  at max dose. -  Prescribed Exforge (amlodipine /olmesartan ) for blood pressure control. - Refilled amlodipine  prescription.  Morbid obesity due to excess calories Discussed Mounjaro  for weight loss with diabetes management. - Prescribed Mounjaro  for weight loss and diabetes control, pending insurance coverage.  Elevated prostate specific antigen (PSA) PSA elevated at 5.5 in July, discussed potential causes. - Rechecked PSA today. -PSA can be elevated with enlarged prostate, prostatitis, prostate cancer or other  Vaccine refused -Recommend multiple vaccines today  Screening for colon cancer -We will try request prior colon cancer   Follow up: pending labs

## 2024-03-25 NOTE — Progress Notes (Signed)
 They do not have any record of him @ [facility] of doing a cologuard screening.

## 2024-03-26 ENCOUNTER — Other Ambulatory Visit: Payer: Self-pay | Admitting: Medical

## 2024-03-26 ENCOUNTER — Ambulatory Visit: Payer: Self-pay | Admitting: Medical

## 2024-03-26 DIAGNOSIS — R972 Elevated prostate specific antigen [PSA]: Secondary | ICD-10-CM

## 2024-03-26 LAB — PSA, TOTAL AND FREE
PSA, Free Pct: 7.7 %
PSA, Free: 0.47 ng/mL
Prostate Specific Ag, Serum: 6.1 ng/mL — ABNORMAL HIGH (ref 0.0–4.0)

## 2024-03-26 NOTE — Progress Notes (Signed)
 PSA marker is elevated.  I placed referral to urology to further evaluate this finding.  If you have not heard back within 2 weeks to schedule an appointment, then let me know

## 2024-04-16 ENCOUNTER — Other Ambulatory Visit: Payer: Self-pay | Admitting: Medical

## 2024-05-02 ENCOUNTER — Other Ambulatory Visit: Payer: Self-pay | Admitting: Family Medicine

## 2024-05-02 MED ORDER — ONDANSETRON 8 MG PO TBDP: 8.0000 mg | ORAL_TABLET | Freq: Three times a day (TID) | ORAL | 0 refills | Status: AC | PRN

## 2024-05-03 ENCOUNTER — Emergency Department (HOSPITAL_COMMUNITY)
Admission: EM | Admit: 2024-05-03 | Discharge: 2024-05-03 | Disposition: A | Discharge status: Home / Self Care | Source: Home / Self Care

## 2024-05-03 ENCOUNTER — Encounter (HOSPITAL_COMMUNITY): Payer: Self-pay

## 2024-05-03 ENCOUNTER — Emergency Department (HOSPITAL_COMMUNITY)

## 2024-05-03 ENCOUNTER — Ambulatory Visit: Payer: Self-pay

## 2024-05-03 DIAGNOSIS — I10 Essential (primary) hypertension: Secondary | ICD-10-CM | POA: Diagnosis not present

## 2024-05-03 DIAGNOSIS — Z79899 Other long term (current) drug therapy: Secondary | ICD-10-CM | POA: Diagnosis not present

## 2024-05-03 DIAGNOSIS — R109 Unspecified abdominal pain: Secondary | ICD-10-CM | POA: Insufficient documentation

## 2024-05-03 DIAGNOSIS — E119 Type 2 diabetes mellitus without complications: Secondary | ICD-10-CM | POA: Diagnosis not present

## 2024-05-03 DIAGNOSIS — Z794 Long term (current) use of insulin: Secondary | ICD-10-CM | POA: Insufficient documentation

## 2024-05-03 DIAGNOSIS — R1110 Vomiting, unspecified: Secondary | ICD-10-CM | POA: Insufficient documentation

## 2024-05-03 LAB — URINALYSIS, MICROSCOPIC (REFLEX)

## 2024-05-03 LAB — CBC WITH DIFFERENTIAL/PLATELET
Abs Immature Granulocytes: 0.02 K/uL (ref 0.00–0.07)
Basophils Absolute: 0 K/uL (ref 0.0–0.1)
Basophils Relative: 1 %
Eosinophils Absolute: 0.1 K/uL (ref 0.0–0.5)
Eosinophils Relative: 2 %
HCT: 47.6 % (ref 39.0–52.0)
Hemoglobin: 16.2 g/dL (ref 13.0–17.0)
Immature Granulocytes: 0 %
Lymphocytes Relative: 34 %
Lymphs Abs: 2 K/uL (ref 0.7–4.0)
MCH: 28.2 pg (ref 26.0–34.0)
MCHC: 34 g/dL (ref 30.0–36.0)
MCV: 82.9 fL (ref 80.0–100.0)
Monocytes Absolute: 0.6 K/uL (ref 0.1–1.0)
Monocytes Relative: 10 %
Neutro Abs: 3.1 K/uL (ref 1.7–7.7)
Neutrophils Relative %: 53 %
Platelets: 312 K/uL (ref 150–400)
RBC: 5.74 MIL/uL (ref 4.22–5.81)
RDW: 13 % (ref 11.5–15.5)
WBC: 5.9 K/uL (ref 4.0–10.5)
nRBC: 0 % (ref 0.0–0.2)

## 2024-05-03 LAB — I-STAT CHEM 8, ED
BUN: 13 mg/dL (ref 8–23)
Calcium, Ion: 1.08 mmol/L — ABNORMAL LOW (ref 1.15–1.40)
Chloride: 100 mmol/L (ref 98–111)
Creatinine, Ser: 1.4 mg/dL — ABNORMAL HIGH (ref 0.61–1.24)
Glucose, Bld: 134 mg/dL — ABNORMAL HIGH (ref 70–99)
HCT: 50 % (ref 39.0–52.0)
Hemoglobin: 17 g/dL (ref 13.0–17.0)
Potassium: 3.3 mmol/L — ABNORMAL LOW (ref 3.5–5.1)
Sodium: 140 mmol/L (ref 135–145)
TCO2: 22 mmol/L (ref 22–32)

## 2024-05-03 LAB — URINALYSIS, ROUTINE W REFLEX MICROSCOPIC
Bilirubin Urine: NEGATIVE
Glucose, UA: NEGATIVE mg/dL
Ketones, ur: NEGATIVE mg/dL
Nitrite: NEGATIVE
Protein, ur: 30 mg/dL — AB
Specific Gravity, Urine: <1.005 — ABNORMAL LOW (ref 1.005–1.030)
pH: 8 (ref 5.0–8.0)

## 2024-05-03 LAB — COMPREHENSIVE METABOLIC PANEL WITH GFR
ALT: 34 U/L (ref 0–44)
AST: 39 U/L (ref 15–41)
Albumin: 5 g/dL (ref 3.5–5.0)
Alkaline Phosphatase: 62 U/L (ref 38–126)
Anion gap: 15 (ref 5–15)
BUN: 13 mg/dL (ref 8–23)
CO2: 24 mmol/L (ref 22–32)
Calcium: 9.7 mg/dL (ref 8.9–10.3)
Chloride: 99 mmol/L (ref 98–111)
Creatinine, Ser: 1.28 mg/dL — ABNORMAL HIGH (ref 0.61–1.24)
GFR, Estimated: >60 mL/min
Glucose, Bld: 147 mg/dL — ABNORMAL HIGH (ref 70–99)
Potassium: 3.4 mmol/L — ABNORMAL LOW (ref 3.5–5.1)
Sodium: 138 mmol/L (ref 135–145)
Total Bilirubin: 0.8 mg/dL (ref 0.0–1.2)
Total Protein: 8.1 g/dL (ref 6.5–8.1)

## 2024-05-03 LAB — LIPASE, BLOOD: Lipase: 34 U/L (ref 11–51)

## 2024-05-03 MED ORDER — IOHEXOL 350 MG/ML SOLN: 75.0000 mL | Freq: Once | INTRAVENOUS | Status: DC | PRN

## 2024-05-03 MED ORDER — ONDANSETRON HCL 4 MG PO TABS: 4.0000 mg | ORAL_TABLET | Freq: Four times a day (QID) | ORAL | 0 refills | Status: AC

## 2024-05-03 MED ORDER — ONDANSETRON HCL 4 MG/2ML IJ SOLN: 4.0000 mg | Freq: Once | INTRAMUSCULAR | Status: AC

## 2024-05-03 MED ORDER — PROMETHAZINE HCL 25 MG/ML IJ SOLN
12.5000 mg | Freq: Four times a day (QID) | INTRAMUSCULAR | Status: DC | PRN
  Filled 2024-05-03: qty 1

## 2024-05-03 MED ORDER — SODIUM CHLORIDE 0.9 % IV SOLN: 8.0000 mg | Freq: Once | INTRAVENOUS | Status: DC

## 2024-05-03 MED ORDER — SODIUM CHLORIDE 0.9 % IV BOLUS
500.0000 mL | Freq: Once | INTRAVENOUS | Status: AC
  Administered 2024-05-03: 500 mL via INTRAVENOUS

## 2024-05-03 MED ORDER — MORPHINE SULFATE (PF) 4 MG/ML IV SOLN
4.0000 mg | Freq: Once | INTRAVENOUS | Status: AC
  Administered 2024-05-03: 4 mg via INTRAVENOUS
  Filled 2024-05-03: qty 1

## 2024-05-03 MED ORDER — ONDANSETRON HCL 4 MG/2ML IJ SOLN
INTRAMUSCULAR | Status: AC
  Administered 2024-05-03: 4 mg via INTRAVENOUS
  Filled 2024-05-03: qty 4

## 2024-05-03 MED ORDER — SODIUM CHLORIDE 0.9 % IV BOLUS: 500.0000 mL | Freq: Once | INTRAVENOUS | Status: DC

## 2024-05-03 MED ORDER — IOHEXOL 350 MG/ML SOLN
75.0000 mL | Freq: Once | INTRAVENOUS | Status: AC | PRN
  Administered 2024-05-03: 75 mL via INTRAVENOUS

## 2024-05-03 MED ORDER — KETOROLAC TROMETHAMINE 15 MG/ML IJ SOLN
15.0000 mg | Freq: Once | INTRAMUSCULAR | Status: AC
  Administered 2024-05-03: 15 mg via INTRAVENOUS
  Filled 2024-05-03: qty 1

## 2024-05-03 MED ORDER — ONDANSETRON HCL 4 MG/2ML IJ SOLN
4.0000 mg | Freq: Once | INTRAMUSCULAR | Status: AC
  Administered 2024-05-03: 4 mg via INTRAVENOUS

## 2024-05-03 MED ORDER — MORPHINE SULFATE (PF) 2 MG/ML IV SOLN
4.0000 mg | Freq: Once | INTRAVENOUS | Status: AC
  Administered 2024-05-03: 4 mg via INTRAVENOUS
  Filled 2024-05-03: qty 2

## 2024-05-03 NOTE — Telephone Encounter (Signed)
 FYI Only or Action Required?: FYI only for provider: ED advised.  Patient was last seen in primary care on 03/25/2024 by Daryl Alm RAMAN, PA-C.  Called Nurse Triage reporting Vomiting.  Symptoms began several days ago.  Interventions attempted: Prescription medications: Zofran.  Symptoms are: unchanged.  Triage Disposition: Go to ED Now (or PCP Triage)  Patient/caregiver understands and will follow disposition?: Yes Reason for Disposition  [1] Vomiting AND [2] contains bile (green color)  Answer Assessment - Initial Assessment Questions Patient's wife Daryl Clark calling in today. Patient started vomiting almost 12 hours after Mounjaro  (5th shot)  Started Zofran, able to keep down apple sauce, banana and sips of liquid. Last bowel movement 5 days ago. Reports vomit is a green color. Advised to ED. Patient's wife agreeable  1. VOMITING SEVERITY: How many times have you vomited in the past 24 hours?    3 times  2. ONSET: When did the vomiting begin?      5 days ago  3. FLUIDS: What fluids or food have you vomited up today? Have you been able to keep any fluids down?     Sips of gatorade and water  4. ABDOMEN PAIN: Are your having any abdomen pain? If Yes : How bad is it and what does it feel like? (e.g., crampy, dull, intermittent, constant)      Cramping   5. DIARRHEA: Is there any diarrhea? If Yes, ask: How many times today?      Denies  7. CAUSE: What do you think is causing your vomiting?     Unsure, Mounjaro    8. HYDRATION STATUS: Any signs of dehydration? (e.g., dry mouth [not only dry lips], too weak to stand) When did you last urinate?     Sips of water   9. OTHER SYMPTOMS: Do you have any other symptoms? (e.g., fever, headache, vertigo, vomiting blood or coffee grounds, recent head injury)     Stomach cramping, not eating  Protocols used: Vomiting-A-AH, Constipation-A-AH  Copied from CRM #[id number]. Topic: Clinical - Red Word Triage >> May 03, 2024 11:26 AM Gattis SQUIBB wrote: Red Word that prompted transfer to Nurse Triage: vomiting since he took hs last Mounjaro  shot.

## 2024-05-03 NOTE — ED Triage Notes (Signed)
 Patient here with abdominal pain for the past 5 days with vomiting for the past 4. He denies diarrhea. He states he has not had a BM since Monday and is saying his abdomen is distended and hard.  He started a  new medication last week called Mounjoro and this is when the issues began.   Denies any history of abdominal issues.

## 2024-05-03 NOTE — Discharge Instructions (Addendum)
 Evaluation today was mostly reassuring.  Suspect your symptoms could be related to recent start of Mounjaro .  Would recommend you follow-up with your PCP to consider adjustment of your medication.  Also CT was mostly unremarkable but there was some concern that there may be slight malrotation of your bowel.  Please follow-up with general surgery.  If you have worsening vomiting, abdominal pain, develop a fever or cannot pass gas or stool or any other concerning symptom please return to ED for further evaluation.

## 2024-05-03 NOTE — ED Triage Notes (Signed)
 Patient states he took the monjoro on Monday, no BM since, vomiting everything he eats, unable to tolerate eating/drinking.

## 2024-05-03 NOTE — ED Provider Notes (Signed)
 " [place] [facility] Provider Note   CSN: [id number] Arrival date & time: 05/03/24  1230     Patient presents with: Abdominal Pain and Emesis  HPI Daryl Clark is a 64 y.o. male with diabetes, hypertension presenting for abdominal pain and vomiting.  Started 5 days ago with vomiting.  No bowel movement since then but still passing gas.  Reports that he started on Mounjaro  5 weeks ago for the first time.  Denies fever.  Pain is all over the abdomen.  Denies any bloody output.  Denies urinary symptoms.    Abdominal Pain Associated symptoms: vomiting   Emesis Associated symptoms: abdominal pain        Prior to Admission medications  Medication Sig Start Date End Date Taking? Authorizing Provider  amlodipine -olmesartan  (AZOR ) 10-20 MG tablet Take 1 tablet by mouth daily. 03/25/24   Tysinger, Alm RAMAN, PA-C  Blood Glucose Monitoring Suppl DEVI 1 each by Does not apply route in the morning, at noon, and at bedtime. May substitute to any manufacturer covered by patient's insurance. 05/05/23   Tysinger, Alm RAMAN, PA-C  insulin  NPH Human (HUMULIN N) 100 UNIT/ML injection Inject 0.08 mLs (8 Units total) into the skin daily before breakfast. 03/25/24   Tysinger, Alm RAMAN, PA-C  Insulin  Pen Needle (BD PEN NEEDLE NANO U/F) 32G X 4 MM MISC 1 each by Does not apply route at bedtime. 05/05/23   Tysinger, Alm RAMAN, PA-C  MOUNJARO  2.5 MG/0.5ML Pen INJECT 1/2 (ONE-HALF) ML SUBCUTANEOUSLY  ONCE A WEEK 04/16/24   Tysinger, Alm RAMAN, PA-C  ondansetron (ZOFRAN-ODT) 8 MG disintegrating tablet Take 1 tablet (8 mg total) by mouth every 8 (eight) hours as needed for nausea or vomiting. 05/02/24   Joyce Norleen BROCKS, MD  rosuvastatin  (CRESTOR ) 10 MG tablet Take 1 tablet (10 mg total) by mouth daily. 03/25/24   Tysinger, Alm RAMAN, PA-C    Allergies: Losartan  potassium-hctz    Review of Systems  Gastrointestinal:  Positive for abdominal pain and vomiting.    Updated Vital Signs BP  (!) 157/92 (BP Location: Right Arm)   Pulse 78   Temp 99.6 F (37.6 C)   Resp (!) 24   Ht 5' 7 (1.702 m)   Wt 113.4 kg   SpO2 100%   BMI 39.16 kg/m   Physical Exam Vitals and nursing note reviewed.  HENT:     Head: Normocephalic and atraumatic.     Mouth/Throat:     Mouth: Mucous membranes are moist.  Eyes:     General:        Right eye: No discharge.        Left eye: No discharge.     Conjunctiva/sclera: Conjunctivae normal.  Cardiovascular:     Rate and Rhythm: Normal rate and regular rhythm.     Pulses: Normal pulses.     Heart sounds: Normal heart sounds.  Pulmonary:     Effort: Pulmonary effort is normal.     Breath sounds: Normal breath sounds.  Abdominal:     General: Abdomen is flat. There is no distension.     Palpations: Abdomen is soft.     Tenderness: There is no abdominal tenderness.  Skin:    General: Skin is warm and dry.  Neurological:     General: No focal deficit present.  Psychiatric:        Mood and Affect: Mood normal.     (all labs ordered are listed, but only abnormal results are  displayed) Labs Reviewed  COMPREHENSIVE METABOLIC PANEL WITH GFR - Abnormal; Notable for the following components:      Result Value   Potassium 3.4 (*)    Glucose, Bld 147 (*)    Creatinine, Ser 1.28 (*)    All other components within normal limits  URINALYSIS, ROUTINE W REFLEX MICROSCOPIC - Abnormal; Notable for the following components:   Specific Gravity, Urine <1.005 (*)    Hgb urine dipstick SMALL (*)    Protein, ur 30 (*)    Leukocytes,Ua TRACE (*)    All other components within normal limits  URINALYSIS, MICROSCOPIC (REFLEX) - Abnormal; Notable for the following components:   Bacteria, UA RARE (*)    All other components within normal limits  I-STAT CHEM 8, ED - Abnormal; Notable for the following components:   Potassium 3.3 (*)    Creatinine, Ser 1.40 (*)    Glucose, Bld 134 (*)    Calcium , Ion 1.08 (*)    All other components within normal  limits  CBC WITH DIFFERENTIAL/PLATELET  LIPASE, BLOOD    EKG: None  Radiology: CT ABDOMEN PELVIS W CONTRAST Result Date: 05/03/2024 CLINICAL DATA:  Abdomen pain emesis EXAM: CT ABDOMEN AND PELVIS WITH CONTRAST TECHNIQUE: Multidetector CT imaging of the abdomen and pelvis was performed using the standard protocol following bolus administration of intravenous contrast. RADIATION DOSE REDUCTION: This exam was performed according to the departmental dose-optimization program which includes automated exposure control, adjustment of the mA and/or kV according to patient size and/or use of iterative reconstruction technique. CONTRAST:  75mL OMNIPAQUE IOHEXOL 350 MG/ML SOLN COMPARISON:  None Available. FINDINGS: Lower chest: Lung bases demonstrate no acute airspace disease. Scattered small pulmonary nodules at both bases. Subpleural right lower lobe pulmonary nodules measuring up to 6 mm on series 5, image 8. Subpleural left lower lobe pulmonary nodule measuring up to 9 mm on series 5, image 33. No acute airspace disease Hepatobiliary: No calcified gallstones or biliary dilatation. Subcentimeter hypodensities too small to further characterize Pancreas: No acute inflammation or ductal dilatation Spleen: Normal in size without focal abnormality. Adrenals/Urinary Tract: Adrenal glands are unremarkable. Kidneys show no hydronephrosis. Punctate nonobstructing stone lower pole left kidney. Stomach/Bowel: Moderate fluid distension of the stomach. No dilated small bowel. No acute bowel wall thickening. Negative appendix. Suspicion of slightly low lying ligament of Treitz not completely to the left of midline, coronal series 6 image 65 and axial series 3 image 32. slight partial swirling configuration of mesentery and vessels but no evidence for an obstruction. Vascular/Lymphatic: Aortic atherosclerosis. No enlarged abdominal or pelvic lymph nodes. Reproductive: Prostate is unremarkable. Other: No ascites or free air  Musculoskeletal: No acute osseous abnormality. Degenerative changes of both hips. IMPRESSION: 1. Moderate fluid distension of the stomach but no dilated small bowel to suggest obstruction. Suspicion of slightly low lying ligament of Treitz not completely to the left of midline, malrotation not excluded, however no signs for obstruction on this exam 2. Scattered small pulmonary nodules at the lung bases measuring up to 9 mm. Non-contrast chest CT at 3-6 months is recommended. If the nodules are stable at time of repeat CT, then future CT at 18-24 months (from today's scan) is considered optional for low-risk patients, but is recommended for high-risk patients. This recommendation follows the consensus statement: Guidelines for Management of Incidental Pulmonary Nodules Detected on CT Images: From the Fleischner Society 2017; Radiology 2017; 284:228-243. 3. Punctate nonobstructing left kidney stone. 4. Aortic atherosclerosis. Aortic Atherosclerosis (ICD10-I70.0). Electronically Signed  By: Luke Bun M.D.   On: 05/03/2024 15:24     Procedures   Medications Ordered in the ED  iohexol (OMNIPAQUE) 350 MG/ML injection 75 mL ( Intravenous Canceled Entry 05/03/24 1417)  sodium chloride 0.9 % bolus 500 mL (has no administration in time range)  ketorolac (TORADOL) 15 MG/ML injection 15 mg (has no administration in time range)  sodium chloride 0.9 % bolus 500 mL (500 mLs Intravenous New Bag/Given 05/03/24 1342)  ondansetron (ZOFRAN) injection 4 mg (4 mg Intravenous Given 05/03/24 1343)  iohexol (OMNIPAQUE) 350 MG/ML injection 75 mL (75 mLs Intravenous Contrast Given 05/03/24 1412)  ondansetron (ZOFRAN) injection 4 mg (4 mg Intravenous Given 05/03/24 1432)  morphine (PF) 2 MG/ML injection 4 mg (4 mg Intravenous Given 05/03/24 1437)  morphine (PF) 4 MG/ML injection 4 mg (4 mg Intravenous Given 05/03/24 1700)                                    Medical Decision Making Risk Prescription drug management.   Initial  Impression and Ddx 64 year old well-appearing male presenting for abdominal pain and vomiting.  Exam was unremarkable.  DDx includes appendicitis, diverticulitis, kidney stone, acute cholecystitis, electrolyte derangement, other. Patient PMH that increases complexity of ED encounter:  diabetes, hypertension  Interpretation of Diagnostics - I independent reviewed and interpreted the labs as followed: unremarkable  - I independently visualized the following imaging with scope of interpretation limited to determining acute life threatening conditions related to emergency care: CT ab/pelvis, which revealed possible malrotation but no obstruction, pulmonary nodules, punctate nonobstructing left kidney stones.  Shared findings with patient.  Patient Reassessment and Ultimate Disposition/Management On reassessment pain was well-controlled and he was able to eat and drink without issue.  Advised surgery follow-up for questionable malrotation but there does not appear to be an obstructive or infectious process in the abdomen at this time.  Sent Zofran to his pharmacy.  Suspect his Mounjaro  could be contributing to his symptoms as well.  Advised PCP follow-up as well.  Discussed return precautions.  Discharged.  Patient management required discussion with the following services or consulting groups:  None  Complexity of Problems Addressed Acute complicated illness or Injury  Additional Data Reviewed and Analyzed Further history obtained from: Past medical history and medications listed in the EMR and Prior ED visit notes  Patient Encounter Risk Assessment Consideration of hospitalization      Final diagnoses:  Abdominal pain, unspecified abdominal location    ED Discharge Orders     None          Daryl Norleen POUR, PA-C 05/03/24 1845    Ula Prentice SAUNDERS, MD 05/04/24 0003  "

## 2024-05-03 NOTE — ED Provider Triage Note (Signed)
 Emergency Medicine Provider Triage Evaluation Note  Daryl Clark , a 64 y.o. male  was evaluated in triage.  Pt complains of vomiting onset Monday. Reports 2-3 episodes daily, non bloody, with upper abdominal pain. Abdominal pain onset with vomiting. Last bowel movement was 5 days ago. On Monjurno, possible recent increase in dose on day symptoms started.   Review of Systems  Positive:  Negative:   Physical Exam  BP 106/87 (BP Location: Left Arm)   Pulse 90   Temp 99.4 F (37.4 C)   Resp (!) 24   SpO2 97%  Gen:   Awake, no distress   Resp:  Normal effort  MSK:   Moves extremities without difficulty  Other:    Medical Decision Making  Medically screening exam initiated at 1:08 PM.  Appropriate orders placed.  Daryl Clark was informed that the remainder of the evaluation will be completed by another provider, this initial triage assessment does not replace that evaluation, and the importance of remaining in the ED until their evaluation is complete.     Beverley Leita LABOR, PA-C 05/03/24 1310

## 2024-05-05 ENCOUNTER — Encounter (HOSPITAL_COMMUNITY): Payer: Self-pay | Admitting: Pharmacy Technician

## 2024-05-05 ENCOUNTER — Emergency Department (HOSPITAL_COMMUNITY)
Admission: EM | Admit: 2024-05-05 | Discharge: 2024-05-05 | Disposition: A | Discharge status: Home / Self Care | Attending: Emergency Medicine | Admitting: Emergency Medicine

## 2024-05-05 ENCOUNTER — Emergency Department (HOSPITAL_COMMUNITY)

## 2024-05-05 ENCOUNTER — Other Ambulatory Visit: Payer: Self-pay

## 2024-05-05 DIAGNOSIS — R112 Nausea with vomiting, unspecified: Secondary | ICD-10-CM | POA: Insufficient documentation

## 2024-05-05 DIAGNOSIS — R1013 Epigastric pain: Secondary | ICD-10-CM | POA: Insufficient documentation

## 2024-05-05 DIAGNOSIS — I10 Essential (primary) hypertension: Secondary | ICD-10-CM | POA: Insufficient documentation

## 2024-05-05 DIAGNOSIS — Z794 Long term (current) use of insulin: Secondary | ICD-10-CM | POA: Insufficient documentation

## 2024-05-05 DIAGNOSIS — Z79899 Other long term (current) drug therapy: Secondary | ICD-10-CM | POA: Insufficient documentation

## 2024-05-05 DIAGNOSIS — K5900 Constipation, unspecified: Secondary | ICD-10-CM | POA: Insufficient documentation

## 2024-05-05 DIAGNOSIS — E119 Type 2 diabetes mellitus without complications: Secondary | ICD-10-CM | POA: Insufficient documentation

## 2024-05-05 LAB — CBC
HCT: 46 % (ref 39.0–52.0)
Hemoglobin: 15.4 g/dL (ref 13.0–17.0)
MCH: 27.9 pg (ref 26.0–34.0)
MCHC: 33.5 g/dL (ref 30.0–36.0)
MCV: 83.5 fL (ref 80.0–100.0)
Platelets: 332 K/uL (ref 150–400)
RBC: 5.51 MIL/uL (ref 4.22–5.81)
RDW: 12.8 % (ref 11.5–15.5)
WBC: 6 K/uL (ref 4.0–10.5)
nRBC: 0 % (ref 0.0–0.2)

## 2024-05-05 LAB — URINALYSIS, ROUTINE W REFLEX MICROSCOPIC
Bacteria, UA: NONE SEEN
Bilirubin Urine: NEGATIVE
Glucose, UA: NEGATIVE mg/dL
Ketones, ur: 5 mg/dL — AB
Nitrite: NEGATIVE
Protein, ur: 100 mg/dL — AB
Specific Gravity, Urine: 1.039 — ABNORMAL HIGH (ref 1.005–1.030)
pH: 5 (ref 5.0–8.0)

## 2024-05-05 LAB — COMPREHENSIVE METABOLIC PANEL WITH GFR
ALT: 27 U/L (ref 0–44)
AST: 35 U/L (ref 15–41)
Albumin: 4.9 g/dL (ref 3.5–5.0)
Alkaline Phosphatase: 57 U/L (ref 38–126)
Anion gap: 14 (ref 5–15)
BUN: 13 mg/dL (ref 8–23)
CO2: 23 mmol/L (ref 22–32)
Calcium: 8.9 mg/dL (ref 8.9–10.3)
Chloride: 102 mmol/L (ref 98–111)
Creatinine, Ser: 1.17 mg/dL (ref 0.61–1.24)
GFR, Estimated: >60 mL/min
Glucose, Bld: 134 mg/dL — ABNORMAL HIGH (ref 70–99)
Potassium: 3.3 mmol/L — ABNORMAL LOW (ref 3.5–5.1)
Sodium: 139 mmol/L (ref 135–145)
Total Bilirubin: 0.7 mg/dL (ref 0.0–1.2)
Total Protein: 7.6 g/dL (ref 6.5–8.1)

## 2024-05-05 LAB — LIPASE, BLOOD: Lipase: 88 U/L — ABNORMAL HIGH (ref 11–51)

## 2024-05-05 MED ORDER — DICYCLOMINE HCL 20 MG PO TABS: 20.0000 mg | ORAL_TABLET | Freq: Two times a day (BID) | ORAL | 0 refills | Status: AC

## 2024-05-05 MED ORDER — SODIUM CHLORIDE 0.9 % IV BOLUS
1000.0000 mL | Freq: Once | INTRAVENOUS | Status: AC
  Administered 2024-05-05: 1000 mL via INTRAVENOUS

## 2024-05-05 MED ORDER — METOCLOPRAMIDE HCL 5 MG/ML IJ SOLN
10.0000 mg | Freq: Once | INTRAMUSCULAR | Status: AC
  Administered 2024-05-05: 10 mg via INTRAVENOUS
  Filled 2024-05-05: qty 2

## 2024-05-05 MED ORDER — METOCLOPRAMIDE HCL 10 MG PO TABS: 10.0000 mg | ORAL_TABLET | Freq: Four times a day (QID) | ORAL | 0 refills | Status: AC

## 2024-05-05 MED ORDER — MORPHINE SULFATE (PF) 4 MG/ML IV SOLN
4.0000 mg | Freq: Once | INTRAVENOUS | Status: AC
  Administered 2024-05-05: 4 mg via INTRAVENOUS
  Filled 2024-05-05: qty 1

## 2024-05-05 MED ORDER — DICYCLOMINE HCL 20 MG PO TABS
20.0000 mg | ORAL_TABLET | Freq: Once | ORAL | Status: AC
  Administered 2024-05-05: 20 mg via ORAL
  Filled 2024-05-05: qty 1

## 2024-05-05 MED ORDER — POTASSIUM CHLORIDE CRYS ER 20 MEQ PO TBCR
40.0000 meq | EXTENDED_RELEASE_TABLET | Freq: Once | ORAL | Status: AC
  Administered 2024-05-05: 40 meq via ORAL
  Filled 2024-05-05: qty 2

## 2024-05-05 MED ORDER — PROMETHAZINE HCL 25 MG RE SUPP: 25.0000 mg | Freq: Four times a day (QID) | RECTAL | 0 refills | Status: AC | PRN

## 2024-05-05 NOTE — Discharge Instructions (Signed)
 You were seen in the emergency department for nausea, vomiting and abdominal pain.  Your workup showed no signs of severe dehydration and no signs of blockages in your intestines.  You likely have some slowed emptying of your stomach related to the Mounjaro  causing some of the pain and nausea.  I have given you Reglan  which is a nausea medication that can help like this and you should take it as needed and I have given you Phenergan  which is a nausea suppository if you are unable to keep the Reglan  down.  You can take Tylenol every 6 hours as needed for pain and I have also given you Bentyl  to help with stomach cramping.  You should follow-up with your primary doctor in the next few days to have your symptoms rechecked.  You should return to the emergency department if you have continued vomiting despite the nausea medicine, significantly worsening pain or any other new or concerning symptoms.

## 2024-05-05 NOTE — ED Triage Notes (Signed)
 Pt here POV with reports of pain from taking monjoro. Has been vomiting the last 5 days, unable to tolerate anything PO. Has not had a bowel movement in 1 week.

## 2024-05-05 NOTE — ED Provider Notes (Signed)
 " [place] EMERGENCY DEPARTMENT AT [facility] Provider Note   CSN: [id number] Arrival date & time: 05/05/24  1415     Patient presents with: Emesis   Daryl Clark is a 64 y.o. male.   Patient is a 64 year old male with a past medical history of hypertension and diabetes on Mounjaro  presenting to the emergency department with nausea and vomiting.  The patient states that he had been on Mounjaro  for the last several weeks and last Monday he had a dose increase.  He states that since then he has had nausea and vomiting and has been hardly able to keep anything down.  He states that he develops pain in his epigastrium prior to vomiting.  He denies any fevers.  He states that he has been constipated and not had a bowel movement in a week. States he is still passing gas. He states that he was seen in the ED 2 days ago and was given Zofran  and reports that despite taking this he has continued to vomit.  The history is provided by the patient and the spouse.  Emesis      Prior to Admission medications  Medication Sig Start Date End Date Taking? Authorizing Provider  dicyclomine  (BENTYL ) 20 MG tablet Take 1 tablet (20 mg total) by mouth 2 (two) times daily. 05/05/24  Yes Kingsley, Korah Hufstedler K, DO  metoCLOPramide  (REGLAN ) 10 MG tablet Take 1 tablet (10 mg total) by mouth every 6 (six) hours. 05/05/24  Yes Ellouise, Lucion Dilger K, DO  promethazine  (PHENERGAN ) 25 MG suppository Place 1 suppository (25 mg total) rectally every 6 (six) hours as needed for nausea or vomiting. 05/05/24  Yes Ellouise, Sennie Borden K, DO  amlodipine -olmesartan  (AZOR ) 10-20 MG tablet Take 1 tablet by mouth daily. 03/25/24   Tysinger, Alm RAMAN, PA-C  Blood Glucose Monitoring Suppl DEVI 1 each by Does not apply route in the morning, at noon, and at bedtime. May substitute to any manufacturer covered by patient's insurance. 05/05/23   Tysinger, Alm RAMAN, PA-C  insulin  NPH Human (HUMULIN N) 100 UNIT/ML injection Inject 0.08 mLs  (8 Units total) into the skin daily before breakfast. 03/25/24   Tysinger, Alm RAMAN, PA-C  Insulin  Pen Needle (BD PEN NEEDLE NANO U/F) 32G X 4 MM MISC 1 each by Does not apply route at bedtime. 05/05/23   Tysinger, Alm RAMAN, PA-C  MOUNJARO  2.5 MG/0.5ML Pen INJECT 1/2 (ONE-HALF) ML SUBCUTANEOUSLY  ONCE A WEEK 04/16/24   Tysinger, Alm RAMAN, PA-C  ondansetron  (ZOFRAN ) 4 MG tablet Take 1 tablet (4 mg total) by mouth every 6 (six) hours. 05/03/24   Robinson, John K, PA-C  ondansetron  (ZOFRAN -ODT) 8 MG disintegrating tablet Take 1 tablet (8 mg total) by mouth every 8 (eight) hours as needed for nausea or vomiting. 05/02/24   Joyce Norleen BROCKS, MD  rosuvastatin  (CRESTOR ) 10 MG tablet Take 1 tablet (10 mg total) by mouth daily. 03/25/24   Tysinger, Alm RAMAN, PA-C    Allergies: Losartan  potassium-hctz    Review of Systems  Gastrointestinal:  Positive for vomiting.    Updated Vital Signs BP 128/62 (BP Location: Right Arm)   Pulse 60   Temp 98.7 F (37.1 C) (Oral)   Resp 20   Ht 5' 9 (1.753 m)   Wt 115.2 kg   SpO2 99%   BMI 37.51 kg/m   Physical Exam Vitals and nursing note reviewed.  Constitutional:      General: He is not in acute distress.    Appearance:  Normal appearance. He is obese.  HENT:     Head: Normocephalic and atraumatic.     Nose: Nose normal.     Mouth/Throat:     Mouth: Mucous membranes are moist.  Eyes:     Extraocular Movements: Extraocular movements intact.  Cardiovascular:     Rate and Rhythm: Normal rate and regular rhythm.     Heart sounds: Normal heart sounds.  Pulmonary:     Effort: Pulmonary effort is normal.     Breath sounds: Normal breath sounds.  Abdominal:     General: Abdomen is flat.     Palpations: Abdomen is soft.     Tenderness: There is no abdominal tenderness.     Comments: Decreased bowel sounds  Musculoskeletal:        General: Normal range of motion.     Cervical back: Normal range of motion.  Skin:    General: Skin is warm and dry.   Neurological:     General: No focal deficit present.     Mental Status: He is alert and oriented to person, place, and time.  Psychiatric:        Mood and Affect: Mood normal.        Behavior: Behavior normal.     (all labs ordered are listed, but only abnormal results are displayed) Labs Reviewed  LIPASE, BLOOD - Abnormal; Notable for the following components:      Result Value   Lipase 88 (*)    All other components within normal limits  COMPREHENSIVE METABOLIC PANEL WITH GFR - Abnormal; Notable for the following components:   Potassium 3.3 (*)    Glucose, Bld 134 (*)    All other components within normal limits  URINALYSIS, ROUTINE W REFLEX MICROSCOPIC - Abnormal; Notable for the following components:   APPearance HAZY (*)    Specific Gravity, Urine 1.039 (*)    Hgb urine dipstick SMALL (*)    Ketones, ur 5 (*)    Protein, ur 100 (*)    Leukocytes,Ua MODERATE (*)    All other components within normal limits  CBC    EKG: None  Radiology: DG Abd Portable 2 Views Result Date: 05/05/2024 CLINICAL DATA:  Lower abdominal pain for 1 week. EXAM: PORTABLE ABDOMEN - 2 VIEW COMPARISON:  Two days ago FINDINGS: No abnormal bowel dilatation. Mild amount of stool seen throughout the colon. There is no evidence of free air. No radio-opaque calculi or other significant radiographic abnormality is seen. IMPRESSION: No abnormal bowel dilatation. Mild stool burden. Electronically Signed   By: Lynwood Landy Raddle M.D.   On: 05/05/2024 17:08     Procedures   Medications Ordered in the ED  metoCLOPramide  (REGLAN ) injection 10 mg (10 mg Intravenous Given 05/05/24 1804)  sodium chloride  0.9 % bolus 1,000 mL (0 mLs Intravenous Stopped 05/05/24 2005)  potassium chloride  SA (KLOR-CON  M) CR tablet 40 mEq (40 mEq Oral Given 05/05/24 1805)  dicyclomine  (BENTYL ) tablet 20 mg (20 mg Oral Given 05/05/24 1808)  morphine  (PF) 4 MG/ML injection 4 mg (4 mg Intravenous Given 05/05/24 1803)    Clinical Course as of  05/05/24 2035  Sun May 05, 2024  1654 UA with moderate leuks but no bacteria, no urinary symptoms making UTI unlikely. Appears mildly dehydrated.  [VK]  1715 Mild amount of stool seen on XR, non-obstructive bowel gas pattern.  [VK]  1940 Patient reports pain and nausea have resolved. Given PO trial. [VK]  2033 Patient able to tolerate PO without return  of symptoms. Is hypertensive now but due for evening BP meds and states he will take them when he gets home. He is stable for discharge with outpatient follow up. [VK]    Clinical Course User Index [VK] Kingsley, Saket Hellstrom K, DO                                 Medical Decision Making This patient presents to the ED with chief complaint(s) of N/V with pertinent past medical history of DM on Monjaro, HTN which further complicates the presenting complaint. The complaint involves an extensive differential diagnosis and also carries with it a high risk of complications and morbidity.    The differential diagnosis includes dehydration, electrolyte abnormality, pancreatitis, hepatitis, gastroparesis, hyperglycemic crisis, SBO, intra-abdominal infection less likely as no point abdominal tenderness  Additional history obtained: Additional history obtained from spouse Records reviewed Primary Care Documents and recent ED records  ED Course and Reassessment: On patient's arrival he is hemodynamically stable in no acute distress.  He labs initiated in triage that showed mild hypokalemia and a slight increase of his lipase otherwise improvement of his creatinine from his last ED visit.  Patient will be given fluids and Reglan  for symptomatic treatment, will have abdominal x-ray to evaluate for any signs of obstruction, did not appear to be constipated on CT 2 days ago.  He will need a p.o. trial and will be closely reassessed.  Independent labs interpretation:  The following labs were independently interpreted: mild hypokalemia, improving Cr  Independent  visualization of imaging: - I independently visualized the following imaging with scope of interpretation limited to determining acute life threatening conditions related to emergency care: Abd XR, which revealed non-obstructive bowel gas pattern  Consultation: - Consulted or discussed management/test interpretation w/ external professional: N/A  Consideration for admission or further workup: Patient has no emergent conditions requiring admission or further work-up at this time and is stable for discharge home with primary care follow-up  Social Determinants of health: N/A    Amount and/or Complexity of Data Reviewed Labs: ordered. Radiology: ordered.  Risk Prescription drug management.       Final diagnoses:  Nausea and vomiting, unspecified vomiting type    ED Discharge Orders          Ordered    dicyclomine  (BENTYL ) 20 MG tablet  2 times daily        05/05/24 2032    metoCLOPramide  (REGLAN ) 10 MG tablet  Every 6 hours        05/05/24 2032    promethazine  (PHENERGAN ) 25 MG suppository  Every 6 hours PRN        05/05/24 2032               Kingsley, Rylee Huestis K, DO 05/05/24 2035  "

## 2024-05-06 ENCOUNTER — Encounter: Payer: Self-pay | Admitting: Internal Medicine

## 2024-05-06 ENCOUNTER — Emergency Department (HOSPITAL_COMMUNITY)

## 2024-05-06 ENCOUNTER — Ambulatory Visit: Payer: Self-pay

## 2024-05-06 ENCOUNTER — Ambulatory Visit: Admitting: Internal Medicine

## 2024-05-06 ENCOUNTER — Inpatient Hospital Stay (HOSPITAL_COMMUNITY)
Admission: EM | Admit: 2024-05-06 | Discharge: 2024-05-10 | DRG: 381 | Disposition: A | Discharge status: Home / Self Care | Attending: Internal Medicine | Admitting: Internal Medicine

## 2024-05-06 VITALS — BP 160/100 | HR 81 | Ht 69.0 in | Wt 256.0 lb

## 2024-05-06 DIAGNOSIS — R748 Abnormal levels of other serum enzymes: Secondary | ICD-10-CM | POA: Diagnosis not present

## 2024-05-06 DIAGNOSIS — I10 Essential (primary) hypertension: Secondary | ICD-10-CM

## 2024-05-06 DIAGNOSIS — Z6837 Body mass index (BMI) 37.0-37.9, adult: Secondary | ICD-10-CM

## 2024-05-06 DIAGNOSIS — I152 Hypertension secondary to endocrine disorders: Secondary | ICD-10-CM

## 2024-05-06 DIAGNOSIS — R972 Elevated prostate specific antigen [PSA]: Secondary | ICD-10-CM

## 2024-05-06 DIAGNOSIS — Z7984 Long term (current) use of oral hypoglycemic drugs: Secondary | ICD-10-CM

## 2024-05-06 DIAGNOSIS — K255 Chronic or unspecified gastric ulcer with perforation: Principal | ICD-10-CM | POA: Diagnosis present

## 2024-05-06 DIAGNOSIS — E118 Type 2 diabetes mellitus with unspecified complications: Secondary | ICD-10-CM | POA: Diagnosis present

## 2024-05-06 DIAGNOSIS — R198 Other specified symptoms and signs involving the digestive system and abdomen: Secondary | ICD-10-CM | POA: Diagnosis present

## 2024-05-06 DIAGNOSIS — E869 Volume depletion, unspecified: Secondary | ICD-10-CM | POA: Diagnosis not present

## 2024-05-06 DIAGNOSIS — R82998 Other abnormal findings in urine: Secondary | ICD-10-CM | POA: Diagnosis not present

## 2024-05-06 DIAGNOSIS — R1084 Generalized abdominal pain: Principal | ICD-10-CM

## 2024-05-06 DIAGNOSIS — K5900 Constipation, unspecified: Secondary | ICD-10-CM | POA: Diagnosis not present

## 2024-05-06 DIAGNOSIS — Z794 Long term (current) use of insulin: Secondary | ICD-10-CM

## 2024-05-06 DIAGNOSIS — Z7985 Long-term (current) use of injectable non-insulin antidiabetic drugs: Secondary | ICD-10-CM | POA: Diagnosis not present

## 2024-05-06 DIAGNOSIS — J101 Influenza due to other identified influenza virus with other respiratory manifestations: Secondary | ICD-10-CM | POA: Diagnosis present

## 2024-05-06 DIAGNOSIS — E1159 Type 2 diabetes mellitus with other circulatory complications: Secondary | ICD-10-CM | POA: Diagnosis present

## 2024-05-06 DIAGNOSIS — R34 Anuria and oliguria: Secondary | ICD-10-CM | POA: Diagnosis not present

## 2024-05-06 DIAGNOSIS — Z888 Allergy status to other drugs, medicaments and biological substances status: Secondary | ICD-10-CM

## 2024-05-06 DIAGNOSIS — R109 Unspecified abdominal pain: Secondary | ICD-10-CM

## 2024-05-06 DIAGNOSIS — E1152 Type 2 diabetes mellitus with diabetic peripheral angiopathy with gangrene: Secondary | ICD-10-CM | POA: Diagnosis present

## 2024-05-06 DIAGNOSIS — Z79899 Other long term (current) drug therapy: Secondary | ICD-10-CM

## 2024-05-06 DIAGNOSIS — J102 Influenza due to other identified influenza virus with gastrointestinal manifestations: Principal | ICD-10-CM | POA: Diagnosis present

## 2024-05-06 DIAGNOSIS — R8281 Pyuria: Secondary | ICD-10-CM | POA: Diagnosis not present

## 2024-05-06 DIAGNOSIS — R195 Other fecal abnormalities: Secondary | ICD-10-CM

## 2024-05-06 DIAGNOSIS — R112 Nausea with vomiting, unspecified: Secondary | ICD-10-CM | POA: Diagnosis present

## 2024-05-06 DIAGNOSIS — E876 Hypokalemia: Secondary | ICD-10-CM | POA: Diagnosis present

## 2024-05-06 DIAGNOSIS — Z1211 Encounter for screening for malignant neoplasm of colon: Secondary | ICD-10-CM

## 2024-05-06 DIAGNOSIS — E785 Hyperlipidemia, unspecified: Secondary | ICD-10-CM | POA: Diagnosis present

## 2024-05-06 DIAGNOSIS — E669 Obesity, unspecified: Secondary | ICD-10-CM | POA: Diagnosis present

## 2024-05-06 DIAGNOSIS — R1013 Epigastric pain: Secondary | ICD-10-CM | POA: Diagnosis present

## 2024-05-06 LAB — URINALYSIS, ROUTINE W REFLEX MICROSCOPIC
Bacteria, UA: NONE SEEN
Bilirubin Urine: NEGATIVE
Glucose, UA: NEGATIVE mg/dL
Ketones, ur: 20 mg/dL — AB
Nitrite: NEGATIVE
Protein, ur: >=300 mg/dL — AB
Specific Gravity, Urine: 1.041 — ABNORMAL HIGH (ref 1.005–1.030)
WBC, UA: >50 WBC/hpf (ref 0–5)
pH: 5 (ref 5.0–8.0)

## 2024-05-06 LAB — POCT URINALYSIS DIP (CLINITEK)
Glucose, UA: NEGATIVE mg/dL
Nitrite, UA: NEGATIVE
POC PROTEIN,UA: 30 — AB
Spec Grav, UA: 1.01
Urobilinogen, UA: 0.2 U/dL
pH, UA: 6

## 2024-05-06 LAB — CBC WITH DIFFERENTIAL/PLATELET
Abs Immature Granulocytes: 0.01 K/uL (ref 0.00–0.07)
Basophils Absolute: 0 K/uL (ref 0.0–0.1)
Basophils Relative: 1 %
Eosinophils Absolute: 0 K/uL (ref 0.0–0.5)
Eosinophils Relative: 1 %
HCT: 45.4 % (ref 39.0–52.0)
Hemoglobin: 15.2 g/dL (ref 13.0–17.0)
Immature Granulocytes: 0 %
Lymphocytes Relative: 44 %
Lymphs Abs: 2.6 K/uL (ref 0.7–4.0)
MCH: 28 pg (ref 26.0–34.0)
MCHC: 33.5 g/dL (ref 30.0–36.0)
MCV: 83.6 fL (ref 80.0–100.0)
Monocytes Absolute: 0.6 K/uL (ref 0.1–1.0)
Monocytes Relative: 11 %
Neutro Abs: 2.5 K/uL (ref 1.7–7.7)
Neutrophils Relative %: 43 %
Platelets: 314 K/uL (ref 150–400)
RBC: 5.43 MIL/uL (ref 4.22–5.81)
RDW: 12.7 % (ref 11.5–15.5)
WBC: 5.8 K/uL (ref 4.0–10.5)
nRBC: 0 % (ref 0.0–0.2)

## 2024-05-06 LAB — COMPREHENSIVE METABOLIC PANEL WITH GFR
ALT: 28 U/L (ref 0–44)
AST: 28 U/L (ref 15–41)
Albumin: 4.6 g/dL (ref 3.5–5.0)
Alkaline Phosphatase: 54 U/L (ref 38–126)
Anion gap: 13 (ref 5–15)
BUN: 12 mg/dL (ref 8–23)
CO2: 23 mmol/L (ref 22–32)
Calcium: 9.1 mg/dL (ref 8.9–10.3)
Chloride: 102 mmol/L (ref 98–111)
Creatinine, Ser: 1.05 mg/dL (ref 0.61–1.24)
GFR, Estimated: >60 mL/min
Glucose, Bld: 138 mg/dL — ABNORMAL HIGH (ref 70–99)
Potassium: 3.6 mmol/L (ref 3.5–5.1)
Sodium: 137 mmol/L (ref 135–145)
Total Bilirubin: 0.8 mg/dL (ref 0.0–1.2)
Total Protein: 7.8 g/dL (ref 6.5–8.1)

## 2024-05-06 LAB — LIPASE, BLOOD: Lipase: 29 U/L (ref 11–51)

## 2024-05-06 LAB — IFOBT (OCCULT BLOOD): IFOBT: POSITIVE

## 2024-05-06 MED ORDER — SODIUM CHLORIDE 0.9 % IV BOLUS
1000.0000 mL | Freq: Once | INTRAVENOUS | Status: AC
  Administered 2024-05-06: 1000 mL via INTRAVENOUS

## 2024-05-06 MED ORDER — ONDANSETRON HCL 4 MG/2ML IJ SOLN
4.0000 mg | Freq: Once | INTRAMUSCULAR | Status: AC
  Administered 2024-05-06: 4 mg via INTRAVENOUS
  Filled 2024-05-06: qty 2

## 2024-05-06 MED ORDER — IOHEXOL 300 MG/ML  SOLN
100.0000 mL | Freq: Once | INTRAMUSCULAR | Status: AC | PRN
  Administered 2024-05-06: 100 mL via INTRAVENOUS

## 2024-05-06 MED ORDER — MORPHINE SULFATE (PF) 4 MG/ML IV SOLN
4.0000 mg | Freq: Once | INTRAVENOUS | Status: AC
  Administered 2024-05-06: 4 mg via INTRAVENOUS
  Filled 2024-05-06: qty 1

## 2024-05-06 NOTE — Progress Notes (Signed)
 "   Patient Care Team: Tysinger, Alm GORMAN RIGGERS as PCP - General (Family Medicine)  Visit Date: 05/06/2024  Subjective:    Patient ID: Daryl Clark , Male   DOB: [birth year], 64 y.o.    MRN: [id number]   64 y.o. Male presents today for Constipation. Patient has a past medical history of Hypertension, Diabetes mellitus, Type II.  Patient is a 64 year old male with a past medical history of hypertension and diabetes on Mounjaro  presenting to the emergency department with nausea and vomiting.  The patient states that he had been on Mounjaro  for the last several weeks and last Monday he had a dose increase.  He states that since then he has had nausea and vomiting and has been hardly able to keep anything down.  He states that he develops pain in his epigastrium prior to vomiting.  He denies any fevers.  He states that he has been constipated and not had a bowel movement in a week. States he is still passing gas. He states that he was seen in the ED 2 days ago and was given Zofran  and reports that despite taking this he has continued to vomit. 05/06/2024 Lipase 88, Potassium 3.3, Blood glucose 134. 05/07/2023 Urinalysis showed urine was hazy with moderate leukocytes. Abdominal X-ray showed mild amount of stool, non-obstructive bowel gas. He was given fluids and Reglan  in the ED as well as bentyl  20 mg.  He was discharged and prescribed Bentyl  20 mg twice daily, Reglan  10 mg every 6 hours and phenergan  25 mg suppository.  He said that it it hurts in the middle of his stomach. He said that he has not had a bowel movement since Sunday, December 28. His wife said that pain relievers have not decreased his pain. He has vomited twice today and it was a brownish color. 05/06/2024 Urinalysis Urine was orange, with moderate amount of Bilirubin and ketones, there was trace blood, Protein= 30 and a small amount of leukocytes. Urine culture pending.   History of Diabetes Mellitus, Type II treated with Mounjaro  2.5 mg  one half injected once a week and 0.08 mL of insulin  injected daily. The last time he took Mounjaro  was the Monday December 29, onset of illness started shortly after.  He currently weighs 256 lbs BMI 37.80.  History of Hypertension treated with Azor  10-20 mg daily. Blood pressure elevated at 160/100.    History of elevated PSA. PSA was 6.1 in December and 5.5 six months ago.    Past Medical History:  Diagnosis Date   Diabetes (HCC)    Hypertension 2019   Obesity      Family History  Problem Relation Age of Onset   Cirrhosis Mother    Other Father        died of natural causes   Heart disease Neg Hx    Cancer Neg Hx    Diabetes Neg Hx     Social History   Social History Narrative   Patient lives at home with his spouse.  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  Exercise - none.  10/2023      Review of Systems  Gastrointestinal:  Positive for abdominal pain, constipation, nausea and vomiting.        Objective:   Vitals: BP (!) 160/100   Pulse 81   Ht 5' 9 (1.753 m)   Wt 256 lb (116.1 kg)   SpO2 98%   BMI 37.80 kg/m    Physical Exam Vitals and nursing note reviewed.  Constitutional:      Appearance: Normal appearance.  HENT:     Head: Normocephalic and atraumatic.     Right Ear: Tympanic membrane, ear canal and external ear normal.     Left Ear: Tympanic membrane, ear canal and external ear normal.     Mouth/Throat:     Mouth: Mucous membranes are moist.     Pharynx: Oropharynx is clear. No oropharyngeal exudate or posterior oropharyngeal erythema.  Abdominal:     Tenderness: There is abdominal tenderness in the right upper quadrant.     Comments: Tenderness Right of midline, right upper quadrant.   Genitourinary:    Prostate: Enlarged.     Rectum: Guaiac result positive.  Skin:    General: Skin is warm and dry.  Neurological:     Mental Status: He is alert and oriented to person, place, and time. Mental status is at baseline.       Results:     Labs:        Component Value Date/Time   NA 139 05/05/2024 1505   NA 137 10/31/2023 1546   K 3.3 (L) 05/05/2024 1505   CL 102 05/05/2024 1505   CO2 23 05/05/2024 1505   GLUCOSE 134 (H) 05/05/2024 1505   BUN 13 05/05/2024 1505   BUN 12 10/31/2023 1546   CREATININE 1.17 05/05/2024 1505   CALCIUM  8.9 05/05/2024 1505   PROT 7.6 05/05/2024 1505   PROT 7.2 10/31/2023 1546   ALBUMIN 4.9 05/05/2024 1505   ALBUMIN 4.7 10/31/2023 1546   AST 35 05/05/2024 1505   ALT 27 05/05/2024 1505   ALKPHOS 57 05/05/2024 1505   BILITOT 0.7 05/05/2024 1505   BILITOT 0.4 10/31/2023 1546   GFRNONAA >60 05/05/2024 1505     Lab Results  Component Value Date   WBC 6.0 05/05/2024   HGB 15.4 05/05/2024   HCT 46.0 05/05/2024   MCV 83.5 05/05/2024   PLT 332 05/05/2024    Lab Results  Component Value Date   CHOL 196 10/31/2023   HDL 49 10/31/2023   LDLCALC 126 (H) 10/31/2023   TRIG 115 10/31/2023   CHOLHDL 4.0 10/31/2023    Lab Results  Component Value Date   HGBA1C 6.9 (H) 10/31/2023     Lab Results  Component Value Date   TSH 3.170 10/31/2023     Lab Results  Component Value Date   PSA1 6.1 (H) 03/25/2024   PSA1 5.5 (H) 10/31/2023      Assessment & Plan:    Guaiac positive stool associated with  Acute Protracted Abdominal pain: Patient is a 64 year old Male with a past medical history of Hypertension and Type 2 Diabetes on Mounjaro  presenting to the Emergency Department with nausea and vomiting.  The patient states that he had been on Mounjaro  for the last several weeks and one week ago, he had a dose increase.    He states that since then he has had nausea and vomiting and has been hardly able to keep anything down.  He states that he notes pain in his epigastrium prior to vomiting.  He denies any fever. He is in acute distress in the exam room due to pain.   Constipation: Has miabdominal pain He states that he has been constipated and not had a bowel movement in a week. States he  still   able to pass gas. He states that he was seen in the ED 2 days ago and was given Zofran  and reports that despite taking this he  has continued to vomit. No BM since Sunday December 28th.  Volume depletion: Urine S.G. Is 1.039  Pyuria- urine specimen yesterday showed 21-50 WBC/HPF. No culture obtained yesterday. Suggest urine culture and repeat  U/A   Elevated lipase-- ? Pancreatitis  05/06/2024 Lipase 88,    Hypokalemia- Potassium 3.3  Blood glucose 134.    Possible UTI : 05/07/2023 Urinalysis showed urine to be  hazy with moderate leukocytes. Abdominal X-ray showed mild amount of stool, non-obstructive bowel gas. He was given IV fluids and Reglan  in the ED as well as Bentyl  20 mg.  He was discharged and prescribed Bentyl  20 mg twice daily, Reglan  10 mg every 6 hours and phenergan  25 mg suppository.      His wife said that pain relievers have not decreased his pain. He has vomited twice today and reports vomitus was a brownish color.   In office urine specimen obtained today, 05/06/2024  shows Urine to be Orange, with moderate amount of Bilirubin and ketones, there was trace  blood, Protein= 30 and a small LE present.    Diabetes Mellitus, Type II: treated with Mounjaro  2.5 mg one half injected once a week and 0.08 mL of insulin  injected daily . The last time he took mounjaro  was the Monday December 29, onset of illness started shortly after.  He currently weighs 256 lbs BMI 37.80.  Hypertension: treated with Amlodipine /Olmesartan  combination 10-20 mg daily. Blood pressure elevated at 160/100.    elevated PSA: PSA was 6.1 in December and 5.5 six months ago. Needs to see Urologist as an out- patient.   Plan:  I think there is very little that can be done today  in the outpatient setting. He needs to be admitted for  IV fluid hydration, pain control,  and evaluation by GI for Abdominal Pain. Consider MRI of Pancreas due to lipase of 88. Needs to get relief of constipation. May not be a suitable  candidate for Mounjaro  therapy.  I,Makayla C Reid,acting as a scribe for Ronal JINNY Hailstone, MD.,have documented all relevant documentation on the behalf of Ronal JINNY Hailstone, MD,as directed by  Ronal JINNY Hailstone, MD while in the presence of Ronal JINNY Hailstone, MD.  I, Ronal JINNY Hailstone, MD, have reviewed all documentation for this visit. The documentation on 05/06/2024 for the exam, diagnosis, procedures, and orders are all accurate and complete.    "

## 2024-05-06 NOTE — Telephone Encounter (Signed)
 FYI Only or Action Required?: FYI only for provider: appointment scheduled on today.  Patient was last seen in primary care on 03/25/2024 by Daryl Alm RAMAN, PA-C.  Called Nurse Triage reporting Constipation.  Symptoms began 04/28/24.  Interventions attempted: Rest and hydration   Symptoms are: unchanged.  Triage Disposition: See HCP Within 4 Hours (Or PCP Triage)  Patient/caregiver understands and will follow disposition?: Yes  Reason for Disposition  [1] Constant abdominal pain AND [2] present > 2 hours    Had imaging at hospital 05/05/24 no blockage  Answer Assessment - Initial Assessment Questions ED on 01/02 and 01/04 for constipation and vomiting X 7 days. Xray, ct scan, blood work-no acute issue. ED feels symptoms are related to Mounjaro .  Mounjaro  started in November 2025.   Requesting SDV to discuss plan for constipation, if it is related to medication. He has not tried any laxatives and would like appointment to discuss with a doctor.   1. STOOL PATTERN OR FREQUENCY: How often do you have a bowel movement (BM)?  (Normal range: 3 times a day to every 3 days)  When was your last BM?       No bm since 04/28/24 2. STRAINING: Do you have to strain to have a BM?      constipated 3. ONSET: When did the constipation begin?     04/28/24 4. RECTAL PAIN: Does your rectum hurt when the stool comes out? If Yes, ask: Do you have hemorrhoids? How bad is the pain?  (Scale 1-10; or mild, moderate, severe)      5. BM COMPOSITION: Are the stools hard?      No bm 6. BLOOD ON STOOLS: Has there been any blood on the toilet tissue or on the surface of the BM? If Yes, ask: When was the last time?     No bm 7. CHRONIC CONSTIPATION: Is this a new problem for you?  If No, ask: How long have you had this problem? (days, weeks, months)      no 8. CHANGES IN DIET OR HYDRATION: Have there been any recent changes in your diet? How much fluids are you drinking on a daily  basis?  How much have you had to drink today?     On liquid, feels like food does not move past his stomach.  9. MEDICINES: Have you been taking any new medicines? Are you taking any narcotic pain medicines? (e.g., Dilaudid , morphine , Percocet, Vicodin)     ED Tylenol and motrin for abdominal pain.  10. LAXATIVES: Have you been using any stool softeners, laxatives, or enemas?  If Yes, ask What are you using, how often, and when was the last time?        11. ACTIVITY:  How much walking do you do every day?  Has your activity level decreased in the past week?         12. CAUSE: What do you think is causing the constipation?        Medication 13. MEDICAL HISTORY: Do you have a history of hemorrhoids, rectal fissures, rectal surgery, or rectal abscess?          14. OTHER SYMPTOMS: Do you have any other symptoms? (e.g., abdomen pain, bloating, fever, vomiting)     Abdominal pain, having liquid diet since 04/30/24, started vomiting 05/01/24-continues to vomit daily, vomiting short time after eating applesauce today, vomiting appears brownish in color-no change since ED visit.  Protocols used: Constipation-A-AH  Copied from CRM [id number]. Topic: Clinical -  Red Word Triage >> May 06, 2024  1:57 PM Rexford HERO wrote: Red Word that prompted transfer to Nurse Triage: Nausea and vomiting green and brown. Has been to emergency room twice. Patient is in a lot of pain. Has not had a bowel movement since 04/28/2024. Complications for Mounjaro . Been on Mounjaro  for 6 weeks.

## 2024-05-06 NOTE — Patient Instructions (Addendum)
 Patient is being referred back to ED for further evaluation. He is not able to adequately hydrate himself. He needs urgent GI consultation.May need MRI of abdomen,. Needs IV fluid hydration. Stool is guaiac positive. His urine is orange in appearance.

## 2024-05-06 NOTE — ED Triage Notes (Addendum)
 Patient reports abdominal pain, constipation, no BM since Sunday, no changes since seen here yesterday, increased pain/n/v. Patient is alert and oriented x 4. Airway patent, respirations even and unlabored. Skin normal, warm and dry.

## 2024-05-06 NOTE — ED Provider Notes (Signed)
 " [facility] AT [facility] Provider Note   CSN: [id number] Arrival date & time: 05/06/24  1732     Patient presents with: Abdominal Pain   Daryl Clark is a 64 y.o. male.  Patient is a 64 year old male with a past medical history of hypertension and diabetes on Mounjaro  presenting to the emergency department with abdominal pain nausea and vomiting.  Patient has been on Mounjaro  for the last several weeks and last Monday Daryl Clark had a dose increase. Daryl Clark states that since then Daryl Clark has had nausea and vomiting and has been hardly able to keep anything down.  Daryl Clark has been able to eat some applesauce and drink water but shortly after will vomit.  Reports last bowel movement was last Sunday.  Patient went to PCP today for further evaluation and was advised to come to the emergency department for further imaging.  Patient does not have any fevers and denies any chest pain or shortness of breath.  Patient reports last episode of vomiting was earlier today.     Prior to Admission medications  Medication Sig Start Date End Date Taking? Authorizing Provider  amlodipine -olmesartan  (AZOR ) 10-20 MG tablet Take 1 tablet by mouth daily. 03/25/24   Tysinger, Alm RAMAN, PA-C  Blood Glucose Monitoring Suppl DEVI 1 each by Does not apply route in the morning, at noon, and at bedtime. May substitute to any manufacturer covered by patient's insurance. 05/05/23   Tysinger, Alm RAMAN, PA-C  dicyclomine  (BENTYL ) 20 MG tablet Take 1 tablet (20 mg total) by mouth 2 (two) times daily. 05/05/24   Kingsley, Victoria K, DO  insulin  NPH Human (HUMULIN N) 100 UNIT/ML injection Inject 0.08 mLs (8 Units total) into the skin daily before breakfast. 03/25/24   Tysinger, Alm RAMAN, PA-C  Insulin  Pen Needle (BD PEN NEEDLE NANO U/F) 32G X 4 MM MISC 1 each by Does not apply route at bedtime. 05/05/23   Tysinger, Alm RAMAN, PA-C  metoCLOPramide  (REGLAN ) 10 MG tablet Take 1 tablet (10 mg total) by mouth every 6 (six)  hours. 05/05/24   Kingsley, Victoria K, DO  MOUNJARO  2.5 MG/0.5ML Pen INJECT 1/2 (ONE-HALF) ML SUBCUTANEOUSLY  ONCE A WEEK 04/16/24   Tysinger, Alm RAMAN, PA-C  ondansetron  (ZOFRAN ) 4 MG tablet Take 1 tablet (4 mg total) by mouth every 6 (six) hours. 05/03/24   Robinson, John K, PA-C  ondansetron  (ZOFRAN -ODT) 8 MG disintegrating tablet Take 1 tablet (8 mg total) by mouth every 8 (eight) hours as needed for nausea or vomiting. 05/02/24   Lalonde, John C, MD  promethazine  (PHENERGAN ) 25 MG suppository Place 1 suppository (25 mg total) rectally every 6 (six) hours as needed for nausea or vomiting. 05/05/24   Kingsley, Victoria K, DO  rosuvastatin  (CRESTOR ) 10 MG tablet Take 1 tablet (10 mg total) by mouth daily. 03/25/24   Tysinger, Alm RAMAN, PA-C    Allergies: Losartan  potassium-hctz    Review of Systems  Gastrointestinal:  Positive for abdominal pain, constipation, nausea and vomiting.  All other systems reviewed and are negative.   Updated Vital Signs BP (!) 147/98 (BP Location: Right Arm)   Pulse 71   Temp 98.7 F (37.1 C) (Oral)   Resp 18   SpO2 98%   Physical Exam Vitals and nursing note reviewed.  Constitutional:      General: Daryl Clark is not in acute distress.    Appearance: Normal appearance. Daryl Clark is obese. Daryl Clark is not ill-appearing.  HENT:     Head: Normocephalic  and atraumatic.     Nose: Nose normal.  Eyes:     Extraocular Movements: Extraocular movements intact.     Conjunctiva/sclera: Conjunctivae normal.     Pupils: Pupils are equal, round, and reactive to light.  Cardiovascular:     Rate and Rhythm: Normal rate.  Pulmonary:     Effort: Pulmonary effort is normal. No respiratory distress.     Breath sounds: Normal breath sounds.  Abdominal:     Palpations: Abdomen is soft.     Tenderness: There is generalized abdominal tenderness.  Musculoskeletal:        General: Normal range of motion.     Cervical back: Normal range of motion.  Neurological:     General: No focal deficit  present.     Mental Status: Daryl Clark is alert.  Psychiatric:        Mood and Affect: Mood normal.        Behavior: Behavior normal.     (all labs ordered are listed, but only abnormal results are displayed) Labs Reviewed  COMPREHENSIVE METABOLIC PANEL WITH GFR - Abnormal; Notable for the following components:      Result Value   Glucose, Bld 138 (*)    All other components within normal limits  URINALYSIS, ROUTINE W REFLEX MICROSCOPIC - Abnormal; Notable for the following components:   Color, Urine AMBER (*)    APPearance HAZY (*)    Specific Gravity, Urine 1.041 (*)    Hgb urine dipstick SMALL (*)    Ketones, ur 20 (*)    Protein, ur >=300 (*)    Leukocytes,Ua MODERATE (*)    All other components within normal limits  LIPASE, BLOOD  CBC WITH DIFFERENTIAL/PLATELET  OCCULT BLOOD X 1 CARD TO LAB, STOOL    EKG: None  Radiology: CT ABDOMEN PELVIS W CONTRAST Result Date: 05/06/2024 EXAM: CT ABDOMEN AND PELVIS WITH CONTRAST 05/06/2024 10:57:00 PM TECHNIQUE: CT of the abdomen and pelvis was performed with the administration of 100 mL of iohexol  (OMNIPAQUE ) 300 MG/ML solution. Multiplanar reformatted images are provided for review. Automated exposure control, iterative reconstruction, and/or weight-based adjustment of the mA/kV was utilized to reduce the radiation dose to as low as reasonably achievable. COMPARISON: CT abdomen and pelvis 05/03/2024. CLINICAL HISTORY: Abdominal pain, acute, nonlocalized; Bowel obstruction suspected. FINDINGS: LOWER CHEST: There is scattered pulmonary nodules measuring 3 mm or less. Similar to prior. LIVER: The liver is unremarkable. GALLBLADDER AND BILE DUCTS: Gallbladder is unremarkable. No biliary ductal dilatation. SPLEEN: No acute abnormality. PANCREAS: No acute abnormality. ADRENAL GLANDS: No acute abnormality. KIDNEYS, URETERS AND BLADDER: There is a punctate calculus in the inferior pole of the left kidney. No stones in the ureters. No hydronephrosis. No  perinephric or periureteral stranding. Urinary bladder is unremarkable. GI AND BOWEL: Moderate air fluid levels again seen within the stomach. There is wall thickening and inflammation of the gastric antrum. There is a tiny focus of air which may represent a gastric diverticulum or microperforation on image 2/27. There is diffuse colonic diverticulosis. The proximal appendix measures 8 mm. Distal appendix appears normal. No surrounding inflammation. There is no bowel obstruction. PERITONEUM AND RETROPERITONEUM: No ascites. No free air. VASCULATURE: Aorta is normal in caliber. LYMPH NODES: No lymphadenopathy. REPRODUCTIVE ORGANS: No acute abnormality. BONES AND SOFT TISSUES: No acute osseous abnormality. No focal soft tissue abnormality. IMPRESSION: 1. Wall thickening and inflammation of the gastric antrum with a tiny focus of air, possibly representing a gastric diverticulum or microperforation from ulcer disease. 2. Mildly prominent  proximal appendix measuring 8 mm with no surrounding inflammation, likely within normal limits. Please correlate clinically. 3. Diffuse colonic diverticulosis without evidence of diverticulitis. 4. Scattered pulmonary nodules measuring up to 3 mm, similar to prior, with no routine follow-up imaging recommended per Fleischner Society Guidelines. Electronically signed by: Greig Pique MD 05/06/2024 11:09 PM EST RP Workstation: [id number]   DG Abd Portable 2 Views Result Date: 05/05/2024 CLINICAL DATA:  Lower abdominal pain for 1 week. EXAM: PORTABLE ABDOMEN - 2 VIEW COMPARISON:  Two days ago FINDINGS: No abnormal bowel dilatation. Mild amount of stool seen throughout the colon. There is no evidence of free air. No radio-opaque calculi or other significant radiographic abnormality is seen. IMPRESSION: No abnormal bowel dilatation. Mild stool burden. Electronically Signed   By: Lynwood Landy Raddle M.D.   On: 05/05/2024 17:08     Procedures   Medications Ordered in the ED  morphine  (PF)  4 MG/ML injection 4 mg (4 mg Intravenous Given 05/06/24 2129)  ondansetron  (ZOFRAN ) injection 4 mg (4 mg Intravenous Given 05/06/24 2129)  sodium chloride  0.9 % bolus 1,000 mL (1,000 mLs Intravenous New Bag/Given 05/06/24 2130)  iohexol  (OMNIPAQUE ) 300 MG/ML solution 100 mL (100 mLs Intravenous Contrast Given 05/06/24 2246)  morphine  (PF) 4 MG/ML injection 4 mg (4 mg Intravenous Given 05/06/24 2306)    64 y.o. male presents to the ED with complaints of abdominal pain with associated nausea vomiting and constipation, The differential diagnosis includes but not limited to bowel obstruction, appendicitis, cholecystitis, pancreatitis,  (Ddx)  On arrival pt is nontoxic, vitals unremarkable. Exam significant for pain throughout entire abdomen to palpation.  I ordered medication morphine  and Zofran  for nausea and pain  Lab Tests:  CMP lipase CBC UA  Imaging Studies ordered:  I ordered imaging studies which included CT abdomen pelvis.  Significant finding for wall thickening inflammation of gastric antrum with tiny focus of air possibly representing gastric diverticulum or microperforation from ulcer disease.  Also noted mildly prominent proximal appendix measuring 8 mm with no surrounding inflammation.  Lipase is unremarkable.  Diverticulosis without diverticulitis and scattered pulmonary nodules.  ED Course:   Patient has been to the emergency department 2 times over the last week for same symptoms.  There is concern that it is secondary to Mounjaro .  Patient has not had bowel movement since last Sunday approximately 8 days.  There has been no concern for bowel obstruction.  The last 2 visits to the emergency department with CT and abdominal x-ray showed no concerning findings.  Since patient still has not had a bowel movement and is still experiencing the same symptoms I will repeat CT today for evaluation and a rectal exam.  GI was consulted after CT scan.  Daryl Clark advises Daryl Clark has low suspicion that this is the  reason for the pain because the patient did not have any evidence of the same on CT scan on the second.  Daryl Clark advised if we were to pursue further imaging to do an upper GI Gastrograph followed by barium swallow.  Make sure the patient is not on any NSAIDs, start him on PPI.  Dr. Aron was consulted with General surgery and agreed with GI recommendation for upper GI Gastrograph and barium swallow follow up.   Will consult hospitalist for admission for concern of microperforation. Dr. Franky was consulted and agreed to admit patient at this time.     Portions of this note were generated with Scientist, clinical (histocompatibility and immunogenetics). Dictation errors may occur despite best attempts  at proofreading.   Final diagnoses:  Generalized abdominal pain    ED Discharge Orders     None          Myriam Fonda GORMAN DEVONNA 05/07/24 1726    Ula Prentice SAUNDERS, MD 05/09/24 [phone]  "

## 2024-05-07 ENCOUNTER — Encounter (HOSPITAL_COMMUNITY): Payer: Self-pay | Admitting: Internal Medicine

## 2024-05-07 ENCOUNTER — Inpatient Hospital Stay (HOSPITAL_COMMUNITY)

## 2024-05-07 ENCOUNTER — Other Ambulatory Visit: Payer: Self-pay

## 2024-05-07 ENCOUNTER — Ambulatory Visit: Admitting: Internal Medicine

## 2024-05-07 DIAGNOSIS — E1159 Type 2 diabetes mellitus with other circulatory complications: Secondary | ICD-10-CM

## 2024-05-07 DIAGNOSIS — Z79899 Other long term (current) drug therapy: Secondary | ICD-10-CM | POA: Diagnosis not present

## 2024-05-07 DIAGNOSIS — J101 Influenza due to other identified influenza virus with other respiratory manifestations: Secondary | ICD-10-CM | POA: Diagnosis present

## 2024-05-07 DIAGNOSIS — Z888 Allergy status to other drugs, medicaments and biological substances status: Secondary | ICD-10-CM | POA: Diagnosis not present

## 2024-05-07 DIAGNOSIS — E669 Obesity, unspecified: Secondary | ICD-10-CM | POA: Diagnosis present

## 2024-05-07 DIAGNOSIS — Z794 Long term (current) use of insulin: Secondary | ICD-10-CM | POA: Diagnosis not present

## 2024-05-07 DIAGNOSIS — I152 Hypertension secondary to endocrine disorders: Secondary | ICD-10-CM

## 2024-05-07 DIAGNOSIS — R112 Nausea with vomiting, unspecified: Secondary | ICD-10-CM | POA: Diagnosis present

## 2024-05-07 DIAGNOSIS — Z6837 Body mass index (BMI) 37.0-37.9, adult: Secondary | ICD-10-CM | POA: Diagnosis not present

## 2024-05-07 DIAGNOSIS — K5900 Constipation, unspecified: Secondary | ICD-10-CM | POA: Diagnosis present

## 2024-05-07 DIAGNOSIS — E876 Hypokalemia: Secondary | ICD-10-CM | POA: Diagnosis present

## 2024-05-07 DIAGNOSIS — R1013 Epigastric pain: Secondary | ICD-10-CM | POA: Diagnosis present

## 2024-05-07 DIAGNOSIS — Z7985 Long-term (current) use of injectable non-insulin antidiabetic drugs: Secondary | ICD-10-CM | POA: Diagnosis not present

## 2024-05-07 DIAGNOSIS — K255 Chronic or unspecified gastric ulcer with perforation: Secondary | ICD-10-CM | POA: Diagnosis present

## 2024-05-07 DIAGNOSIS — E785 Hyperlipidemia, unspecified: Secondary | ICD-10-CM | POA: Insufficient documentation

## 2024-05-07 DIAGNOSIS — E1152 Type 2 diabetes mellitus with diabetic peripheral angiopathy with gangrene: Secondary | ICD-10-CM | POA: Diagnosis present

## 2024-05-07 DIAGNOSIS — E118 Type 2 diabetes mellitus with unspecified complications: Secondary | ICD-10-CM

## 2024-05-07 DIAGNOSIS — R1084 Generalized abdominal pain: Secondary | ICD-10-CM | POA: Diagnosis present

## 2024-05-07 DIAGNOSIS — R198 Other specified symptoms and signs involving the digestive system and abdomen: Secondary | ICD-10-CM | POA: Diagnosis not present

## 2024-05-07 DIAGNOSIS — J102 Influenza due to other identified influenza virus with gastrointestinal manifestations: Secondary | ICD-10-CM | POA: Diagnosis present

## 2024-05-07 LAB — GLUCOSE, CAPILLARY
Glucose-Capillary: 87 mg/dL (ref 70–99)
Glucose-Capillary: 91 mg/dL (ref 70–99)

## 2024-05-07 LAB — COMPREHENSIVE METABOLIC PANEL WITH GFR
ALT: 24 U/L (ref 0–44)
AST: 23 U/L (ref 15–41)
Albumin: 4.4 g/dL (ref 3.5–5.0)
Alkaline Phosphatase: 46 U/L (ref 38–126)
Anion gap: 13 (ref 5–15)
BUN: 11 mg/dL (ref 8–23)
CO2: 22 mmol/L (ref 22–32)
Calcium: 8.8 mg/dL — ABNORMAL LOW (ref 8.9–10.3)
Chloride: 103 mmol/L (ref 98–111)
Creatinine, Ser: 0.92 mg/dL (ref 0.61–1.24)
GFR, Estimated: >60 mL/min
Glucose, Bld: 107 mg/dL — ABNORMAL HIGH (ref 70–99)
Potassium: 3.5 mmol/L (ref 3.5–5.1)
Sodium: 138 mmol/L (ref 135–145)
Total Bilirubin: 0.6 mg/dL (ref 0.0–1.2)
Total Protein: 7.1 g/dL (ref 6.5–8.1)

## 2024-05-07 LAB — CBC WITH DIFFERENTIAL/PLATELET
Abs Immature Granulocytes: 0.01 K/uL (ref 0.00–0.07)
Basophils Absolute: 0 K/uL (ref 0.0–0.1)
Basophils Relative: 1 %
Eosinophils Absolute: 0 K/uL (ref 0.0–0.5)
Eosinophils Relative: 1 %
HCT: 41.4 % (ref 39.0–52.0)
Hemoglobin: 14.2 g/dL (ref 13.0–17.0)
Immature Granulocytes: 0 %
Lymphocytes Relative: 38 %
Lymphs Abs: 2.2 K/uL (ref 0.7–4.0)
MCH: 28.3 pg (ref 26.0–34.0)
MCHC: 34.3 g/dL (ref 30.0–36.0)
MCV: 82.6 fL (ref 80.0–100.0)
Monocytes Absolute: 0.7 K/uL (ref 0.1–1.0)
Monocytes Relative: 12 %
Neutro Abs: 2.9 K/uL (ref 1.7–7.7)
Neutrophils Relative %: 48 %
Platelets: 282 K/uL (ref 150–400)
RBC: 5.01 MIL/uL (ref 4.22–5.81)
RDW: 12.8 % (ref 11.5–15.5)
WBC: 5.9 K/uL (ref 4.0–10.5)
nRBC: 0 % (ref 0.0–0.2)

## 2024-05-07 LAB — HEMOGLOBIN A1C
Hgb A1c MFr Bld: 6 % — ABNORMAL HIGH (ref 4.8–5.6)
Mean Plasma Glucose: 125.5 mg/dL

## 2024-05-07 LAB — CBG MONITORING, ED
Glucose-Capillary: 95 mg/dL (ref 70–99)
Glucose-Capillary: 117 mg/dL — ABNORMAL HIGH (ref 70–99)
Glucose-Capillary: 93 mg/dL (ref 70–99)

## 2024-05-07 LAB — TROPONIN T, HIGH SENSITIVITY
Troponin T High Sensitivity: <15 ng/L (ref 0–19)
Troponin T High Sensitivity: <15 ng/L (ref 0–19)

## 2024-05-07 LAB — URINE CULTURE
MICRO NUMBER:: 17425792
Result:: NO GROWTH
SPECIMEN QUALITY:: ADEQUATE

## 2024-05-07 LAB — OCCULT BLOOD X 1 CARD TO LAB, STOOL: Fecal Occult Bld: NEGATIVE

## 2024-05-07 LAB — HIV ANTIBODY (ROUTINE TESTING W REFLEX): HIV Screen 4th Generation wRfx: NONREACTIVE

## 2024-05-07 MED ORDER — PANTOPRAZOLE SODIUM 40 MG IV SOLR
40.0000 mg | Freq: Two times a day (BID) | INTRAVENOUS | Status: DC
  Administered 2024-05-07 – 2024-05-10 (×6): 40 mg via INTRAVENOUS
  Filled 2024-05-07 (×6): qty 10

## 2024-05-07 MED ORDER — PIPERACILLIN-TAZOBACTAM 3.375 G IVPB
3.3750 g | Freq: Three times a day (TID) | INTRAVENOUS | Status: DC
  Administered 2024-05-07 – 2024-05-10 (×11): 3.375 g via INTRAVENOUS
  Filled 2024-05-07 (×11): qty 50

## 2024-05-07 MED ORDER — HYDRALAZINE HCL 20 MG/ML IJ SOLN
5.0000 mg | INTRAMUSCULAR | Status: DC | PRN
  Filled 2024-05-07: qty 1

## 2024-05-07 MED ORDER — HYDROMORPHONE HCL 1 MG/ML IJ SOLN
1.0000 mg | INTRAMUSCULAR | Status: DC | PRN
  Administered 2024-05-07 – 2024-05-08 (×2): 1 mg via INTRAVENOUS
  Filled 2024-05-07 (×2): qty 1

## 2024-05-07 MED ORDER — PANTOPRAZOLE SODIUM 40 MG IV SOLR
40.0000 mg | INTRAVENOUS | Status: DC
  Administered 2024-05-07: 40 mg via INTRAVENOUS
  Filled 2024-05-07: qty 10

## 2024-05-07 MED ORDER — MORPHINE SULFATE (PF) 4 MG/ML IV SOLN
4.0000 mg | INTRAVENOUS | Status: DC | PRN
  Administered 2024-05-07 (×2): 4 mg via INTRAVENOUS
  Filled 2024-05-07 (×2): qty 1

## 2024-05-07 MED ORDER — ONDANSETRON HCL 4 MG/2ML IJ SOLN: 4.0000 mg | Freq: Four times a day (QID) | INTRAMUSCULAR | Status: DC | PRN

## 2024-05-07 MED ORDER — DEXTROSE-SODIUM CHLORIDE 5-0.45 % IV SOLN: INTRAVENOUS | Status: AC

## 2024-05-07 MED ORDER — INSULIN ASPART 100 UNIT/ML IJ SOLN
0.0000 [IU] | INTRAMUSCULAR | Status: DC
  Administered 2024-05-08 – 2024-05-09 (×2): 1 [IU] via SUBCUTANEOUS
  Filled 2024-05-07: qty 1

## 2024-05-07 MED ORDER — IOHEXOL 300 MG/ML  SOLN
150.0000 mL | Freq: Once | INTRAMUSCULAR | Status: AC | PRN
  Administered 2024-05-07: 150 mL via ORAL

## 2024-05-07 NOTE — Progress Notes (Addendum)
 [facility] PROGRESS NOTE    Progress Note  Daryl Clark  FMW:[id number] DOB: 29-Jan-1961 DOA: 05/06/2024 PCP: Bulah Alm RAMAN, PA-C     Brief Narrative:   OTHO MICHALIK is an 64 y.o. male with past medical history of essential hypertension, diabetes mellitus type 2 hyperlipidemia comes into the ED for worsening abdominal pai,n nausea and vomiting that started about a week prior to admission last bowel movement was about a week prior to admission, in the ER was found to have perforated viscus.  General surgery was consulted along with gastroenterology.   Significant studies: CT scan of the abdomen pelvis showed inflammatory changes of the gastric antrum with either possible microperforation, diffuse diverticulosis without diverticulitis.  Antibiotics: None  Microbiology data: Blood culture:  Procedures: None  Assessment/Plan:   Abdominal pain possibly due to gastric microperforation/acute perforated viscus: CT scan of the abdomen pelvis showed wall thickening of the gastric antrum with a tiny small focus of possible air GI and general surgery has been consulted. Keep the patient n.p.o. start empirically on IV PPI. Barium Gastrografin has been sent. Start IV fluids  Essential hypertension: IV hydralazine  patient NPO. Hold all antihypertensive medication  DM (diabetes mellitus), type 2 with complications (HCC) Recently started on Mounjaro  at home on NPH. A1c of 6.9. Hold all oral hypoglycemic agents and insulin . Started on sliding scale insulin  CBGs every 4.  Morbid obesity (HCC) Noted.   HLD (hyperlipidemia) N.P.o. for now.    DVT prophylaxis: lovenox Family Communication:none Status is: Inpatient Remains inpatient appropriate because: Possible acute perforation    Code Status:     Code Status Orders  (From admission, onward)           Start     Ordered   05/07/24 0331  Full code  Continuous       Question:  By:  Answer:  Consent:  discussion documented in EHR   05/07/24 0332           Code Status History     This patient has a current code status but no historical code status.         IV Access:   Peripheral IV   Procedures and diagnostic studies:   CT ABDOMEN PELVIS W CONTRAST Result Date: 05/06/2024 EXAM: CT ABDOMEN AND PELVIS WITH CONTRAST 05/06/2024 10:57:00 PM TECHNIQUE: CT of the abdomen and pelvis was performed with the administration of 100 mL of iohexol  (OMNIPAQUE ) 300 MG/ML solution. Multiplanar reformatted images are provided for review. Automated exposure control, iterative reconstruction, and/or weight-based adjustment of the mA/kV was utilized to reduce the radiation dose to as low as reasonably achievable. COMPARISON: CT abdomen and pelvis 05/03/2024. CLINICAL HISTORY: Abdominal pain, acute, nonlocalized; Bowel obstruction suspected. FINDINGS: LOWER CHEST: There is scattered pulmonary nodules measuring 3 mm or less. Similar to prior. LIVER: The liver is unremarkable. GALLBLADDER AND BILE DUCTS: Gallbladder is unremarkable. No biliary ductal dilatation. SPLEEN: No acute abnormality. PANCREAS: No acute abnormality. ADRENAL GLANDS: No acute abnormality. KIDNEYS, URETERS AND BLADDER: There is a punctate calculus in the inferior pole of the left kidney. No stones in the ureters. No hydronephrosis. No perinephric or periureteral stranding. Urinary bladder is unremarkable. GI AND BOWEL: Moderate air fluid levels again seen within the stomach. There is wall thickening and inflammation of the gastric antrum. There is a tiny focus of air which may represent a gastric diverticulum or microperforation on image 2/27. There is diffuse colonic diverticulosis. The proximal appendix measures 8 mm. Distal appendix appears normal.  No surrounding inflammation. There is no bowel obstruction. PERITONEUM AND RETROPERITONEUM: No ascites. No free air. VASCULATURE: Aorta is normal in caliber. LYMPH NODES: No lymphadenopathy.  REPRODUCTIVE ORGANS: No acute abnormality. BONES AND SOFT TISSUES: No acute osseous abnormality. No focal soft tissue abnormality. IMPRESSION: 1. Wall thickening and inflammation of the gastric antrum with a tiny focus of air, possibly representing a gastric diverticulum or microperforation from ulcer disease. 2. Mildly prominent proximal appendix measuring 8 mm with no surrounding inflammation, likely within normal limits. Please correlate clinically. 3. Diffuse colonic diverticulosis without evidence of diverticulitis. 4. Scattered pulmonary nodules measuring up to 3 mm, similar to prior, with no routine follow-up imaging recommended per Fleischner Society Guidelines. Electronically signed by: Greig Pique MD 05/06/2024 11:09 PM EST RP Workstation: [id number]   DG Abd Portable 2 Views Result Date: 05/05/2024 CLINICAL DATA:  Lower abdominal pain for 1 week. EXAM: PORTABLE ABDOMEN - 2 VIEW COMPARISON:  Two days ago FINDINGS: No abnormal bowel dilatation. Mild amount of stool seen throughout the colon. There is no evidence of free air. No radio-opaque calculi or other significant radiographic abnormality is seen. IMPRESSION: No abnormal bowel dilatation. Mild stool burden. Electronically Signed   By: Lynwood Landy Raddle M.D.   On: 05/05/2024 17:08     Medical Consultants:   None.   Subjective:    Daryl Clark pain not controlled has not had a bowel movement in over a week  Objective:    Vitals:   05/07/24 0130 05/07/24 0315 05/07/24 0400 05/07/24 0440  BP: (!) 166/113 (!) 152/111 (!) 141/91 (!) 165/103  Pulse: 72 70 68 72  Resp: 18 18 11 15   Temp: 98 F (36.7 C)     TempSrc: Oral     SpO2: 100% 100% 95% 97%   SpO2: 97 %   Intake/Output Summary (Last 24 hours) at 05/07/2024 9379 Last data filed at 05/07/2024 0232 Gross per 24 hour  Intake 1000 ml  Output --  Net 1000 ml   There were no vitals filed for this visit.  Exam: General exam: In no acute distress. Respiratory system:  Good air movement and clear to auscultation. Cardiovascular system: S1 & S2 heard, RRR. No JVD. Gastrointestinal system: No bowel sounds distended soft tender diffusely no rebound or guarding Extremities: No pedal edema. Skin: No rashes, lesions or ulcers Psychiatry: Judgement and insight appear normal. Mood & affect appropriate.    Data Reviewed:    Labs: Basic Metabolic Panel: Recent Labs  Lab 05/03/24 1311 05/03/24 1331 05/05/24 1505 05/06/24 1752 05/07/24 0401  NA 138 140 139 137 138  K 3.4* 3.3* 3.3* 3.6 3.5  CL 99 100 102 102 103  CO2 24  --  23 23 22   GLUCOSE 147* 134* 134* 138* 107*  BUN 13 13 13 12 11   CREATININE 1.28* 1.40* 1.17 1.05 0.92  CALCIUM  9.7  --  8.9 9.1 8.8*   GFR Estimated Creatinine Clearance: 103.3 mL/min (by C-G formula based on SCr of 0.92 mg/dL). Liver Function Tests: Recent Labs  Lab 05/03/24 1311 05/05/24 1505 05/06/24 1752 05/07/24 0401  AST 39 35 28 23  ALT 34 27 28 24   ALKPHOS 62 57 54 46  BILITOT 0.8 0.7 0.8 0.6  PROT 8.1 7.6 7.8 7.1  ALBUMIN 5.0 4.9 4.6 4.4   Recent Labs  Lab 05/03/24 1311 05/05/24 1505 05/06/24 1752  LIPASE 34 88* 29   No results for input(s): AMMONIA in the last 168 hours. Coagulation profile No results for  input(s): INR, PROTIME in the last 168 hours. COVID-19 Labs  No results for input(s): DDIMER, FERRITIN, LDH, CRP in the last 72 hours.  No results found for: SARSCOV2NAA  CBC: Recent Labs  Lab 05/03/24 1311 05/03/24 1331 05/05/24 1505 05/06/24 1752 05/07/24 0401  WBC 5.9  --  6.0 5.8 5.9  NEUTROABS 3.1  --   --  2.5 2.9  HGB 16.2 17.0 15.4 15.2 14.2  HCT 47.6 50.0 46.0 45.4 41.4  MCV 82.9  --  83.5 83.6 82.6  PLT 312  --  332 314 282   Cardiac Enzymes: No results for input(s): CKTOTAL, CKMB, CKMBINDEX, TROPONINI in the last 168 hours. BNP (last 3 results) No results for input(s): PROBNP in the last 8760 hours. CBG: Recent Labs  Lab 05/07/24 0418  GLUCAP  95   D-Dimer: No results for input(s): DDIMER in the last 72 hours. Hgb A1c: No results for input(s): HGBA1C in the last 72 hours. Lipid Profile: No results for input(s): CHOL, HDL, LDLCALC, TRIG, CHOLHDL, LDLDIRECT in the last 72 hours. Thyroid function studies: No results for input(s): TSH, T4TOTAL, T3FREE, THYROIDAB in the last 72 hours.  Invalid input(s): FREET3 Anemia work up: No results for input(s): VITAMINB12, FOLATE, FERRITIN, TIBC, IRON, RETICCTPCT in the last 72 hours. Sepsis Labs: Recent Labs  Lab 05/03/24 1311 05/05/24 1505 05/06/24 1752 05/07/24 0401  WBC 5.9 6.0 5.8 5.9   Microbiology No results found for this or any previous visit (from the past 240 hours).   Medications:    insulin  aspart  0-9 Units Subcutaneous Q4H   pantoprazole  (PROTONIX ) IV  40 mg Intravenous Q24H   Continuous Infusions:  piperacillin -tazobactam (ZOSYN )  IV 3.375 g (05/07/24 0257)      LOS: 0 days   Erle Odell Castor  [facility]  05/07/2024, 6:20 AM

## 2024-05-07 NOTE — H&P (Signed)
 " History and Physical    Daryl Clark FMW:[id number] DOB: [birth year] DOA: 05/06/2024  Patient coming from: Home.  Chief Complaint: Abdominal pain.  HPI: Daryl Clark is a 64 y.o. male with history of hypertension, diabetes mellitus type 2, morbid obesity, hyperlipidemia who presented to the ER because of worsening abdominal pain with nausea vomiting.  Patient has been having abdominal pain for last 1 week.  This is the third visit to the ER in the last 1 week.  Pain is mostly in the epigastric area and lower quadrants.  Has had multiple episodes of nausea and vomiting.  Last bowel movement was almost a week ago.  Patient was started on Mounjaro  about 6 weeks ago and has lost about 40 pounds per patient.  ED Course: In the ER patient had a CT abdomen pelvis which shows inflammation of the gastric antrum with tiny focus of air which could be either gastric diverticulum or microperforation.  General surgeon Dr. Aron and gastroenterologist Dr. Rosalie was consulted.  Patient admitted for further observation and management.  Labs showed WBC 5.8 AST and ALT were 29/28 creatinine 1.05.  Review of Systems: As per HPI, rest all negative.   Past Medical History:  Diagnosis Date   Diabetes (HCC)    Hypertension 2019   Obesity     Past Surgical History:  Procedure Laterality Date   ANKLE SURGERY Left    COLONOSCOPY  2020   [facility]   LEG SURGERY Right      reports that he has never smoked. He has never used smokeless tobacco. He reports that he does not currently use alcohol. He reports that he does not use drugs.  Allergies[1]  Family History  Problem Relation Age of Onset   Cirrhosis Mother    Other Father        died of natural causes   Heart disease Neg Hx    Cancer Neg Hx    Diabetes Neg Hx     Prior to Admission medications  Medication Sig Start Date End Date Taking? Authorizing Provider  acetaminophen (TYLENOL) 500 MG tablet Take 500 mg by mouth every 6 (six) hours as  needed.   Yes [provider]  amlodipine -atorvastatin (CADUET) 10-20 MG tablet Take 1 tablet by mouth daily.   Yes [provider]  ibuprofen (ADVIL) 200 MG tablet Take 200 mg by mouth every 6 (six) hours as needed.   Yes [provider]  insulin  NPH Human (HUMULIN N) 100 UNIT/ML injection Inject 0.08 mLs (8 Units total) into the skin daily before breakfast. Patient taking differently: Inject 3-5 Units into the skin daily before breakfast. 03/25/24  Yes Tysinger, Alm RAMAN, PA-C  MOUNJARO  2.5 MG/0.5ML Pen INJECT 1/2 (ONE-HALF) ML SUBCUTANEOUSLY  ONCE A WEEK 04/16/24  Yes Tysinger, Alm RAMAN, PA-C  ondansetron  (ZOFRAN -ODT) 8 MG disintegrating tablet Take 1 tablet (8 mg total) by mouth every 8 (eight) hours as needed for nausea or vomiting. 05/02/24  Yes Joyce Norleen BROCKS, MD  promethazine  (PHENERGAN ) 25 MG suppository Place 1 suppository (25 mg total) rectally every 6 (six) hours as needed for nausea or vomiting. 05/05/24  Yes Ellouise, Victoria K, DO  rosuvastatin  (CRESTOR ) 10 MG tablet Take 1 tablet (10 mg total) by mouth daily. 03/25/24  Yes Tysinger, Alm RAMAN, PA-C  amlodipine -olmesartan  (AZOR ) 10-20 MG tablet Take 1 tablet by mouth daily. Patient not taking: Reported on 05/07/2024 03/25/24   Bulah Alm RAMAN, PA-C  Blood Glucose Monitoring Suppl DEVI 1 each by  Does not apply route in the morning, at noon, and at bedtime. May substitute to any manufacturer covered by patient's insurance. 05/05/23   Tysinger, Alm RAMAN, PA-C  dicyclomine  (BENTYL ) 20 MG tablet Take 1 tablet (20 mg total) by mouth 2 (two) times daily. 05/05/24   Kingsley, Victoria K, DO  Insulin  Pen Needle (BD PEN NEEDLE NANO U/F) 32G X 4 MM MISC 1 each by Does not apply route at bedtime. 05/05/23   Tysinger, Alm RAMAN, PA-C  metoCLOPramide  (REGLAN ) 10 MG tablet Take 1 tablet (10 mg total) by mouth every 6 (six) hours. 05/05/24   Kingsley, Victoria K, DO  ondansetron  (ZOFRAN ) 4 MG tablet Take 1 tablet (4 mg total) by mouth every 6  (six) hours. Patient not taking: Reported on 05/07/2024 05/03/24   Lang Norleen POUR, PA-C    Physical Exam: Constitutional: Moderately built and nourished. Vitals:   05/06/24 1745 05/06/24 2147 05/07/24 0130 05/07/24 0315  BP: (!) 169/108 (!) 147/98 (!) 166/113 (!) 152/111  Pulse: 82 71 72 70  Resp:  18 18 18   Temp:  98.7 F (37.1 C) 98 F (36.7 C)   TempSrc:  Oral Oral   SpO2: 100% 98% 100% 100%   Eyes: Anicteric no pallor. ENMT: No discharge from the ears eyes nose or mouth. Neck: No mass felt.  No neck rigidity. Respiratory: No rhonchi or crepitations. Cardiovascular: S1-S2 heard. Abdomen: Epigastric tenderness no guarding or rigidity. Musculoskeletal: No edema. Skin: No rash. Neurologic: Alert awake oriented to time place and person.  Moves all extremities. Psychiatric: Appears normal.  Normal affect.   Labs on Admission: I have personally reviewed following labs and imaging studies  CBC: Recent Labs  Lab 05/03/24 1311 05/03/24 1331 05/05/24 1505 05/06/24 1752  WBC 5.9  --  6.0 5.8  NEUTROABS 3.1  --   --  2.5  HGB 16.2 17.0 15.4 15.2  HCT 47.6 50.0 46.0 45.4  MCV 82.9  --  83.5 83.6  PLT 312  --  332 314   Basic Metabolic Panel: Recent Labs  Lab 05/03/24 1311 05/03/24 1331 05/05/24 1505 05/06/24 1752  NA 138 140 139 137  K 3.4* 3.3* 3.3* 3.6  CL 99 100 102 102  CO2 24  --  23 23  GLUCOSE 147* 134* 134* 138*  BUN 13 13 13 12   CREATININE 1.28* 1.40* 1.17 1.05  CALCIUM  9.7  --  8.9 9.1   GFR: Estimated Creatinine Clearance: 90.5 mL/min (by C-G formula based on SCr of 1.05 mg/dL). Liver Function Tests: Recent Labs  Lab 05/03/24 1311 05/05/24 1505 05/06/24 1752  AST 39 35 28  ALT 34 27 28  ALKPHOS 62 57 54  BILITOT 0.8 0.7 0.8  PROT 8.1 7.6 7.8  ALBUMIN 5.0 4.9 4.6   Recent Labs  Lab 05/03/24 1311 05/05/24 1505 05/06/24 1752  LIPASE 34 88* 29   No results for input(s): AMMONIA in the last 168 hours. Coagulation Profile: No results for  input(s): INR, PROTIME in the last 168 hours. Cardiac Enzymes: No results for input(s): CKTOTAL, CKMB, CKMBINDEX, TROPONINI in the last 168 hours. BNP (last 3 results) No results for input(s): PROBNP in the last 8760 hours. HbA1C: No results for input(s): HGBA1C in the last 72 hours. CBG: No results for input(s): GLUCAP in the last 168 hours. Lipid Profile: No results for input(s): CHOL, HDL, LDLCALC, TRIG, CHOLHDL, LDLDIRECT in the last 72 hours. Thyroid Function Tests: No results for input(s): TSH, T4TOTAL, FREET4, T3FREE, THYROIDAB in the last  72 hours. Anemia Panel: No results for input(s): VITAMINB12, FOLATE, FERRITIN, TIBC, IRON, RETICCTPCT in the last 72 hours. Urine analysis:    Component Value Date/Time   COLORURINE AMBER (A) 05/06/2024 1940   APPEARANCEUR HAZY (A) 05/06/2024 1940   LABSPEC 1.041 (H) 05/06/2024 1940   LABSPEC 1.015 10/31/2023 1539   PHURINE 5.0 05/06/2024 1940   GLUCOSEU NEGATIVE 05/06/2024 1940   HGBUR SMALL (A) 05/06/2024 1940   BILIRUBINUR NEGATIVE 05/06/2024 1940   BILIRUBINUR moderate (A) 05/06/2024 1656   KETONESUR 20 (A) 05/06/2024 1940   KETONESUR moderate (40) (A) 05/06/2024 1656   PROTEINUR >=300 (A) 05/06/2024 1940   UROBILINOGEN 0.2 05/06/2024 1656   NITRITE NEGATIVE 05/06/2024 1940   NITRITE Negative 05/06/2024 1656   LEUKOCYTESUR MODERATE (A) 05/06/2024 1940   LEUKOCYTESUR Small (1+) (A) 05/06/2024 1656   Sepsis Labs: @LABRCNTIP (procalcitonin:4,lacticidven:4) )No results found for this or any previous visit (from the past 240 hours).   Radiological Exams on Admission: CT ABDOMEN PELVIS W CONTRAST Result Date: 05/06/2024 EXAM: CT ABDOMEN AND PELVIS WITH CONTRAST 05/06/2024 10:57:00 PM TECHNIQUE: CT of the abdomen and pelvis was performed with the administration of 100 mL of iohexol  (OMNIPAQUE ) 300 MG/ML solution. Multiplanar reformatted images are provided for review. Automated exposure  control, iterative reconstruction, and/or weight-based adjustment of the mA/kV was utilized to reduce the radiation dose to as low as reasonably achievable. COMPARISON: CT abdomen and pelvis 05/03/2024. CLINICAL HISTORY: Abdominal pain, acute, nonlocalized; Bowel obstruction suspected. FINDINGS: LOWER CHEST: There is scattered pulmonary nodules measuring 3 mm or less. Similar to prior. LIVER: The liver is unremarkable. GALLBLADDER AND BILE DUCTS: Gallbladder is unremarkable. No biliary ductal dilatation. SPLEEN: No acute abnormality. PANCREAS: No acute abnormality. ADRENAL GLANDS: No acute abnormality. KIDNEYS, URETERS AND BLADDER: There is a punctate calculus in the inferior pole of the left kidney. No stones in the ureters. No hydronephrosis. No perinephric or periureteral stranding. Urinary bladder is unremarkable. GI AND BOWEL: Moderate air fluid levels again seen within the stomach. There is wall thickening and inflammation of the gastric antrum. There is a tiny focus of air which may represent a gastric diverticulum or microperforation on image 2/27. There is diffuse colonic diverticulosis. The proximal appendix measures 8 mm. Distal appendix appears normal. No surrounding inflammation. There is no bowel obstruction. PERITONEUM AND RETROPERITONEUM: No ascites. No free air. VASCULATURE: Aorta is normal in caliber. LYMPH NODES: No lymphadenopathy. REPRODUCTIVE ORGANS: No acute abnormality. BONES AND SOFT TISSUES: No acute osseous abnormality. No focal soft tissue abnormality. IMPRESSION: 1. Wall thickening and inflammation of the gastric antrum with a tiny focus of air, possibly representing a gastric diverticulum or microperforation from ulcer disease. 2. Mildly prominent proximal appendix measuring 8 mm with no surrounding inflammation, likely within normal limits. Please correlate clinically. 3. Diffuse colonic diverticulosis without evidence of diverticulitis. 4. Scattered pulmonary nodules measuring up to 3  mm, similar to prior, with no routine follow-up imaging recommended per Fleischner Society Guidelines. Electronically signed by: Greig Pique MD 05/06/2024 11:09 PM EST RP Workstation: [id number]   DG Abd Portable 2 Views Result Date: 05/05/2024 CLINICAL DATA:  Lower abdominal pain for 1 week. EXAM: PORTABLE ABDOMEN - 2 VIEW COMPARISON:  Two days ago FINDINGS: No abnormal bowel dilatation. Mild amount of stool seen throughout the colon. There is no evidence of free air. No radio-opaque calculi or other significant radiographic abnormality is seen. IMPRESSION: No abnormal bowel dilatation. Mild stool burden. Electronically Signed   By: Lynwood Landy Raddle M.D.   On: 05/05/2024  17:08    EKG: Independently reviewed.  Sinus rhythm.  Assessment/Plan Principal Problem:   Perforated viscus Active Problems:   Obesity   DM (diabetes mellitus), type 2 with complications (HCC)   Hypertension associated with diabetes (HCC)   Morbid obesity (HCC)   HLD (hyperlipidemia)    Abdominal pain concerning for possible gastric microperforation from peptic ulcer disease versus gastric diverticulum.  [facility] Dr. Rosalie and general surgeon Dr. Aron has been consulted.  Will keep patient n.p.o. and keep patient on IV PPI and antibiotics for now.  Barium swallow and Gastrografin studies were requested. Hypertension uncontrolled takes amlodipine  at home.  Will keep patient on as needed IV hydralazine . Diabetes mellitus type 2 recently started Mounjaro  and patient's NPH insulin  was decreased to 5 units.  Last hemoglobin A1c was 6.9 on October 31, 2023.  On sliding scale coverage. Hyperlipidemia on statins.  Since patient has possible microperforation will need further management and more than 2 midnight stay.  DVT prophylaxis: SCDs. Code Status: Full code. Family Communication: Discussed with patient's wife at the bedside. Disposition Plan: Medical floor. Consults called: General Surgery and gastroenterologist. Admission  status: Inpatient.         [1]  Allergies Allergen Reactions   Losartan  Potassium-Hctz Other (See Comments)    Cramps a lot    "

## 2024-05-07 NOTE — Consult Note (Signed)
 Reason for Consult:vomiting Referring Physician: Dr. Celinda Doretta JONETTA Daryl Clark is an 64 y.o. male.  HPI: The patient is a 64 year old black male who began having upper abdominal pain and nausea a couple days ago.  Yesterday he had several episodes of vomiting.  He came to the emergency department where a CT scan was performed that questioned a dot of air outside of the stomach wall.  His white count is normal.  He denies any fevers or chills.  He started taking Mounjaro  about 6 weeks ago.  He denies any previous abdominal surgery.  Past Medical History:  Diagnosis Date   Diabetes (HCC)    Hypertension 2019   Obesity     Past Surgical History:  Procedure Laterality Date   ANKLE SURGERY Left    COLONOSCOPY  2020   [facility]   LEG SURGERY Right     Family History  Problem Relation Age of Onset   Cirrhosis Mother    Other Father        died of natural causes   Heart disease Neg Hx    Cancer Neg Hx    Diabetes Neg Hx     Social History:  reports that he has never smoked. He has never used smokeless tobacco. He reports that he does not currently use alcohol. He reports that he does not use drugs.  Allergies: Allergies[1]  Medications: I have reviewed the patient's current medications.  Results for orders placed or performed during the hospital encounter of 05/06/24 (from the past 48 hours)  Comprehensive metabolic panel     Status: Abnormal   Collection Time: 05/06/24  5:52 PM  Result Value Ref Range   Sodium 137 135 - 145 mmol/L   Potassium 3.6 3.5 - 5.1 mmol/L   Chloride 102 98 - 111 mmol/L   CO2 23 22 - 32 mmol/L   Glucose, Bld 138 (H) 70 - 99 mg/dL    Comment: Glucose reference range applies only to samples taken after fasting for at least 8 hours.   BUN 12 8 - 23 mg/dL   Creatinine, Ser 8.94 0.61 - 1.24 mg/dL   Calcium  9.1 8.9 - 10.3 mg/dL   Total Protein 7.8 6.5 - 8.1 g/dL   Albumin 4.6 3.5 - 5.0 g/dL   AST 28 15 - 41 U/L   ALT 28 0 - 44 U/L   Alkaline Phosphatase  54 38 - 126 U/L   Total Bilirubin 0.8 0.0 - 1.2 mg/dL   GFR, Estimated >39 >39 mL/min    Comment: (NOTE) Calculated using the CKD-EPI Creatinine Equation (2021)    Anion gap 13 5 - 15    Comment: Performed at [facility], [address] [place], [place] [address]  Lipase, blood     Status: None   Collection Time: 05/06/24  5:52 PM  Result Value Ref Range   Lipase 29 11 - 51 U/L    Comment: Performed at [facility], [address] [place], [place] [address]  CBC with Diff     Status: None   Collection Time: 05/06/24  5:52 PM  Result Value Ref Range   WBC 5.8 4.0 - 10.5 K/uL   RBC 5.43 4.22 - 5.81 MIL/uL   Hemoglobin 15.2 13.0 - 17.0 g/dL   HCT 54.5 60.9 - 47.9 %   MCV 83.6 80.0 - 100.0 fL   MCH 28.0 26.0 - 34.0 pg   MCHC 33.5 30.0 - 36.0 g/dL   RDW 12.7  11.5 - 15.5 %   Platelets 314 150 - 400 K/uL   nRBC 0.0 0.0 - 0.2 %   Neutrophils Relative % 43 %   Neutro Abs 2.5 1.7 - 7.7 K/uL   Lymphocytes Relative 44 %   Lymphs Abs 2.6 0.7 - 4.0 K/uL   Monocytes Relative 11 %   Monocytes Absolute 0.6 0.1 - 1.0 K/uL   Eosinophils Relative 1 %   Eosinophils Absolute 0.0 0.0 - 0.5 K/uL   Basophils Relative 1 %   Basophils Absolute 0.0 0.0 - 0.1 K/uL   Immature Granulocytes 0 %   Abs Immature Granulocytes 0.01 0.00 - 0.07 K/uL    Comment: Performed at [facility], [address], [place], [place] [address]  Urinalysis, Routine w reflex microscopic -Urine, Clean Catch     Status: Abnormal   Collection Time: 05/06/24  7:40 PM  Result Value Ref Range   Color, Urine AMBER (A) YELLOW    Comment: BIOCHEMICALS MAY BE AFFECTED BY COLOR   APPearance HAZY (A) CLEAR   Specific Gravity, Urine 1.041 (H) 1.005 - 1.030   pH 5.0 5.0 - 8.0   Glucose, UA NEGATIVE NEGATIVE mg/dL   Hgb urine dipstick SMALL (A) NEGATIVE   Bilirubin Urine NEGATIVE NEGATIVE   Ketones, ur 20 (A) NEGATIVE mg/dL   Protein, ur >=699 (A) NEGATIVE mg/dL   Nitrite  NEGATIVE NEGATIVE   Leukocytes,Ua MODERATE (A) NEGATIVE   RBC / HPF 11-20 0 - 5 RBC/hpf   WBC, UA >50 0 - 5 WBC/hpf   Bacteria, UA NONE SEEN NONE SEEN   Squamous Epithelial / HPF 6-10 0 - 5 /HPF   Mucus PRESENT     Comment: Performed at [facility], [address], [place], [place] [address]  Occult blood card to lab, stool     Status: None   Collection Time: 05/06/24 11:57 PM  Result Value Ref Range   Fecal Occult Bld NEGATIVE NEGATIVE    Comment: Performed at [facility], [address], [place], [place] [address]  Troponin T, High Sensitivity     Status: None   Collection Time: 05/07/24  2:21 AM  Result Value Ref Range   Troponin T High Sensitivity <15 0 - 19 ng/L    Comment: (NOTE) Biotin concentrations > 1000 ng/mL falsely decrease TnT results.  Serial cardiac troponin measurements are suggested.  Refer to the Links section for chest pain algorithms and additional  guidance. Performed at [facility], [address], [place], [place] [address]   Comprehensive metabolic panel     Status: Abnormal   Collection Time: 05/07/24  4:01 AM  Result Value Ref Range   Sodium 138 135 - 145 mmol/L   Potassium 3.5 3.5 - 5.1 mmol/L   Chloride 103 98 - 111 mmol/L   CO2 22 22 - 32 mmol/L   Glucose, Bld 107 (H) 70 - 99 mg/dL    Comment: Glucose reference range applies only to samples taken after fasting for at least 8 hours.   BUN 11 8 - 23 mg/dL   Creatinine, Ser 9.07 0.61 - 1.24 mg/dL   Calcium  8.8 (L) 8.9 - 10.3 mg/dL   Total Protein 7.1 6.5 - 8.1 g/dL   Albumin 4.4 3.5 - 5.0 g/dL   AST 23 15 - 41 U/L   ALT 24 0 - 44 U/L   Alkaline Phosphatase 46 38 - 126 U/L   Total Bilirubin 0.6 0.0 - 1.2 mg/dL  GFR, Estimated >60 >60 mL/min    Comment: (NOTE) Calculated using the CKD-EPI Creatinine Equation (2021)    Anion gap 13 5 - 15    Comment: Performed at [facility], [address], [place], [place]  [address]  CBC with Differential/Platelet     Status: None   Collection Time: 05/07/24  4:01 AM  Result Value Ref Range   WBC 5.9 4.0 - 10.5 K/uL   RBC 5.01 4.22 - 5.81 MIL/uL   Hemoglobin 14.2 13.0 - 17.0 g/dL   HCT 58.5 60.9 - 47.9 %   MCV 82.6 80.0 - 100.0 fL   MCH 28.3 26.0 - 34.0 pg   MCHC 34.3 30.0 - 36.0 g/dL   RDW 87.1 88.4 - 84.4 %   Platelets 282 150 - 400 K/uL   nRBC 0.0 0.0 - 0.2 %   Neutrophils Relative % 48 %   Neutro Abs 2.9 1.7 - 7.7 K/uL   Lymphocytes Relative 38 %   Lymphs Abs 2.2 0.7 - 4.0 K/uL   Monocytes Relative 12 %   Monocytes Absolute 0.7 0.1 - 1.0 K/uL   Eosinophils Relative 1 %   Eosinophils Absolute 0.0 0.0 - 0.5 K/uL   Basophils Relative 1 %   Basophils Absolute 0.0 0.0 - 0.1 K/uL   Immature Granulocytes 0 %   Abs Immature Granulocytes 0.01 0.00 - 0.07 K/uL    Comment: Performed at [facility], [address], [place], [place] [address]  Troponin T, High Sensitivity     Status: None   Collection Time: 05/07/24  4:02 AM  Result Value Ref Range   Troponin T High Sensitivity <15 0 - 19 ng/L    Comment: (NOTE) Biotin concentrations > 1000 ng/mL falsely decrease TnT results.  Serial cardiac troponin measurements are suggested.  Refer to the Links section for chest pain algorithms and additional  guidance. Performed at [facility], [address] [place], [place] [address]   Hemoglobin A1c     Status: Abnormal   Collection Time: 05/07/24  4:02 AM  Result Value Ref Range   Hgb A1c MFr Bld 6.0 (H) 4.8 - 5.6 %    Comment: (NOTE) Diagnosis of Diabetes The following HbA1c ranges recommended by the American Diabetes Association (ADA) may be used as an aid in the diagnosis of diabetes mellitus.  Hemoglobin             Suggested A1C NGSP%              Diagnosis  <5.7                   Non Diabetic  5.7-6.4                Pre-Diabetic  >6.4                   Diabetic  <7.0                   Glycemic control  for                       adults with diabetes.     Mean Plasma Glucose 125.5 mg/dL    Comment: Performed at [facility], [address], [place], [place] [address]  CBG monitoring, ED     Status: None   Collection Time: 05/07/24  4:18 AM  Result Value Ref Range   Glucose-Capillary 95 70 -  99 mg/dL    Comment: Glucose reference range applies only to samples taken after fasting for at least 8 hours.    CT ABDOMEN PELVIS W CONTRAST Result Date: 05/06/2024 EXAM: CT ABDOMEN AND PELVIS WITH CONTRAST 05/06/2024 10:57:00 PM TECHNIQUE: CT of the abdomen and pelvis was performed with the administration of 100 mL of iohexol  (OMNIPAQUE ) 300 MG/ML solution. Multiplanar reformatted images are provided for review. Automated exposure control, iterative reconstruction, and/or weight-based adjustment of the mA/kV was utilized to reduce the radiation dose to as low as reasonably achievable. COMPARISON: CT abdomen and pelvis 05/03/2024. CLINICAL HISTORY: Abdominal pain, acute, nonlocalized; Bowel obstruction suspected. FINDINGS: LOWER CHEST: There is scattered pulmonary nodules measuring 3 mm or less. Similar to prior. LIVER: The liver is unremarkable. GALLBLADDER AND BILE DUCTS: Gallbladder is unremarkable. No biliary ductal dilatation. SPLEEN: No acute abnormality. PANCREAS: No acute abnormality. ADRENAL GLANDS: No acute abnormality. KIDNEYS, URETERS AND BLADDER: There is a punctate calculus in the inferior pole of the left kidney. No stones in the ureters. No hydronephrosis. No perinephric or periureteral stranding. Urinary bladder is unremarkable. GI AND BOWEL: Moderate air fluid levels again seen within the stomach. There is wall thickening and inflammation of the gastric antrum. There is a tiny focus of air which may represent a gastric diverticulum or microperforation on image 2/27. There is diffuse colonic diverticulosis. The proximal appendix measures 8 mm. Distal appendix appears normal. No surrounding  inflammation. There is no bowel obstruction. PERITONEUM AND RETROPERITONEUM: No ascites. No free air. VASCULATURE: Aorta is normal in caliber. LYMPH NODES: No lymphadenopathy. REPRODUCTIVE ORGANS: No acute abnormality. BONES AND SOFT TISSUES: No acute osseous abnormality. No focal soft tissue abnormality. IMPRESSION: 1. Wall thickening and inflammation of the gastric antrum with a tiny focus of air, possibly representing a gastric diverticulum or microperforation from ulcer disease. 2. Mildly prominent proximal appendix measuring 8 mm with no surrounding inflammation, likely within normal limits. Please correlate clinically. 3. Diffuse colonic diverticulosis without evidence of diverticulitis. 4. Scattered pulmonary nodules measuring up to 3 mm, similar to prior, with no routine follow-up imaging recommended per Fleischner Society Guidelines. Electronically signed by: Greig Pique MD 05/06/2024 11:09 PM EST RP Workstation: [id number]   DG Abd Portable 2 Views Result Date: 05/05/2024 CLINICAL DATA:  Lower abdominal pain for 1 week. EXAM: PORTABLE ABDOMEN - 2 VIEW COMPARISON:  Two days ago FINDINGS: No abnormal bowel dilatation. Mild amount of stool seen throughout the colon. There is no evidence of free air. No radio-opaque calculi or other significant radiographic abnormality is seen. IMPRESSION: No abnormal bowel dilatation. Mild stool burden. Electronically Signed   By: Lynwood Landy Raddle M.D.   On: 05/05/2024 17:08    Review of Systems  Constitutional: Negative.   HENT: Negative.    Eyes: Negative.   Respiratory: Negative.    Cardiovascular: Negative.   Gastrointestinal:  Positive for abdominal pain, nausea and vomiting.  Endocrine: Negative.   Genitourinary: Negative.   Musculoskeletal: Negative.   Skin: Negative.   Allergic/Immunologic: Negative.   Neurological: Negative.   Hematological: Negative.   Psychiatric/Behavioral: Negative.     Blood pressure 120/80, pulse 86, temperature 98 F (36.7  C), temperature source Oral, resp. rate 16, SpO2 94%. Physical Exam Vitals reviewed.  Constitutional:      General: He is not in acute distress.    Appearance: Normal appearance. He is obese.  HENT:     Head: Normocephalic and atraumatic.     Right Ear: External ear normal.  Left Ear: External ear normal.     Nose: Nose normal.     Mouth/Throat:     Mouth: Mucous membranes are moist.     Pharynx: Oropharynx is clear.  Eyes:     General: No scleral icterus.    Extraocular Movements: Extraocular movements intact.     Conjunctiva/sclera: Conjunctivae normal.     Pupils: Pupils are equal, round, and reactive to light.  Cardiovascular:     Rate and Rhythm: Normal rate and regular rhythm.     Pulses: Normal pulses.     Heart sounds: Normal heart sounds.  Pulmonary:     Effort: Pulmonary effort is normal. No respiratory distress.     Breath sounds: Normal breath sounds.  Abdominal:     General: Abdomen is flat.     Palpations: Abdomen is soft.     Tenderness: There is no abdominal tenderness.  Musculoskeletal:        General: No swelling or deformity. Normal range of motion.     Cervical back: Normal range of motion and neck supple.  Skin:    General: Skin is warm and dry.     Coloration: Skin is not jaundiced.  Neurological:     General: No focal deficit present.     Mental Status: He is alert and oriented to person, place, and time.  Psychiatric:        Mood and Affect: Mood normal.        Behavior: Behavior normal.     Assessment/Plan: The patient has a questionable area of gastric perforation but a totally benign exam.  At this point I would treat him with bowel rest and broad-spectrum antibiotic therapy.  Will rest his bowels today and then tomorrow plan for upper GI to rule out leak from the stomach.  If this study tomorrow is negative for leak then we will slowly advance his diet.  We will follow him closely with you.  Deward Null III 05/07/2024, 8:08 AM          [1]  Allergies Allergen Reactions   Losartan  Potassium-Hctz Other (See Comments)    Cramps a lot

## 2024-05-08 ENCOUNTER — Ambulatory Visit: Payer: Self-pay | Admitting: Internal Medicine

## 2024-05-08 DIAGNOSIS — R198 Other specified symptoms and signs involving the digestive system and abdomen: Secondary | ICD-10-CM | POA: Diagnosis not present

## 2024-05-08 LAB — GLUCOSE, CAPILLARY
Glucose-Capillary: 95 mg/dL (ref 70–99)
Glucose-Capillary: 88 mg/dL (ref 70–99)
Glucose-Capillary: 96 mg/dL (ref 70–99)
Glucose-Capillary: 108 mg/dL — ABNORMAL HIGH (ref 70–99)
Glucose-Capillary: 133 mg/dL — ABNORMAL HIGH (ref 70–99)
Glucose-Capillary: 98 mg/dL (ref 70–99)

## 2024-05-08 MED ORDER — CARMEX CLASSIC LIP BALM EX OINT
TOPICAL_OINTMENT | CUTANEOUS | Status: DC | PRN
  Administered 2024-05-08: 1 via TOPICAL
  Filled 2024-05-08: qty 10

## 2024-05-08 NOTE — Progress Notes (Signed)
" °   05/08/24 1636  TOC Brief Assessment  Insurance and Status Reviewed  Patient has primary care physician Yes  Home environment has been reviewed resides in a private residence  Prior level of function: Independent  Prior/Current Home Services No current home services  Social Drivers of Health Review SDOH reviewed no interventions necessary  Readmission risk has been reviewed Yes  Transition of care needs no transition of care needs at this time    "

## 2024-05-08 NOTE — Plan of Care (Signed)
   Problem: Fluid Volume: Goal: Ability to maintain a balanced intake and output will improve Outcome: Progressing

## 2024-05-08 NOTE — Progress Notes (Signed)
 "      Subjective: Patient feels well this morning.  No nausea or vomiting.  hungry  ROS: See above, otherwise other systems negative  Objective: Vital signs in last 24 hours: Temp:  [97.9 F (36.6 C)-98.8 F (37.1 C)] 98.1 F (36.7 C) (01/07 0407) Pulse Rate:  [60-71] 65 (01/07 0407) Resp:  [16-20] 16 (01/07 0407) BP: (129-158)/(75-102) 131/75 (01/07 0407) SpO2:  [96 %-100 %] 98 % (01/07 0407) Weight:  [115.2 kg-116.1 kg] 116.1 kg (01/06 1448) Last BM Date : 05/07/24  Intake/Output from previous day: 01/06 0701 - 01/07 0700 In: 789.7 [I.V.:614.4; IV Piggyback:175.3] Out: -  Intake/Output this shift: No intake/output data recorded.  PE: Abd: soft, NT, ND, obese  Lab Results:  Recent Labs    05/06/24 1752 05/07/24 0401  WBC 5.8 5.9  HGB 15.2 14.2  HCT 45.4 41.4  PLT 314 282   BMET Recent Labs    05/06/24 1752 05/07/24 0401  NA 137 138  K 3.6 3.5  CL 102 103  CO2 23 22  GLUCOSE 138* 107*  BUN 12 11  CREATININE 1.05 0.92  CALCIUM  9.1 8.8*   PT/INR No results for input(s): LABPROT, INR in the last 72 hours. CMP     Component Value Date/Time   NA 138 05/07/2024 0401   NA 137 10/31/2023 1546   K 3.5 05/07/2024 0401   CL 103 05/07/2024 0401   CO2 22 05/07/2024 0401   GLUCOSE 107 (H) 05/07/2024 0401   BUN 11 05/07/2024 0401   BUN 12 10/31/2023 1546   CREATININE 0.92 05/07/2024 0401   CALCIUM  8.8 (L) 05/07/2024 0401   PROT 7.1 05/07/2024 0401   PROT 7.2 10/31/2023 1546   ALBUMIN 4.4 05/07/2024 0401   ALBUMIN 4.7 10/31/2023 1546   AST 23 05/07/2024 0401   ALT 24 05/07/2024 0401   ALKPHOS 46 05/07/2024 0401   BILITOT 0.6 05/07/2024 0401   BILITOT 0.4 10/31/2023 1546   GFRNONAA >60 05/07/2024 0401   Lipase     Component Value Date/Time   LIPASE 29 05/06/2024 1752       Studies/Results: DG UGI W SINGLE CM (SOL OR THIN BA) Result Date: 05/07/2024 CLINICAL DATA:  64 year old male. Endorsing acute onset of generalized abdominal  pain and vomiting. Team is requesting upper GI for further evaluation of possible perforation EXAM: DG UGI W SINGLE CM TECHNIQUE: Scout radiograph was obtained. Single contrast examination was performed using water soluable contrast. This exam was performed by Delon Beagle NP, and was supervised and interpreted by Dr. Cordella Banner . FLUOROSCOPY: Radiation Exposure Index (as provided by the fluoroscopic device): 2.7 mGy Kerma COMPARISON:  CT abdomen pelvis dated May 06, 2024 FINDINGS: Scout Radiograph: Within normal limit Esophagus:  Normal appearance. Esophageal motility:  Within normal limits. Gastroesophageal reflux:  None visualized. Ingested 13mm barium tablet:  Not given Stomach: Persistent small ovoid area of pooling suggesting an outpouching of a diverticula. No hiatal hernia. Gastric emptying: Normal. Duodenum:  Normal appearance. Other:  None. IMPRESSION: Problem oriented water-soluble upper GI. Small focus of contrast pooling in the antrum suggestive of a small antral diverticula. Consider endoscopic for further evaluation Electronically Signed   By: Cordella Banner   On: 05/07/2024 10:34   CT ABDOMEN PELVIS W CONTRAST Result Date: 05/06/2024 EXAM: CT ABDOMEN AND PELVIS WITH CONTRAST 05/06/2024 10:57:00 PM TECHNIQUE: CT of the abdomen and pelvis was performed with the administration of 100 mL of iohexol  (OMNIPAQUE ) 300 MG/ML solution. Multiplanar reformatted images are  provided for review. Automated exposure control, iterative reconstruction, and/or weight-based adjustment of the mA/kV was utilized to reduce the radiation dose to as low as reasonably achievable. COMPARISON: CT abdomen and pelvis 05/03/2024. CLINICAL HISTORY: Abdominal pain, acute, nonlocalized; Bowel obstruction suspected. FINDINGS: LOWER CHEST: There is scattered pulmonary nodules measuring 3 mm or less. Similar to prior. LIVER: The liver is unremarkable. GALLBLADDER AND BILE DUCTS: Gallbladder is unremarkable. No biliary  ductal dilatation. SPLEEN: No acute abnormality. PANCREAS: No acute abnormality. ADRENAL GLANDS: No acute abnormality. KIDNEYS, URETERS AND BLADDER: There is a punctate calculus in the inferior pole of the left kidney. No stones in the ureters. No hydronephrosis. No perinephric or periureteral stranding. Urinary bladder is unremarkable. GI AND BOWEL: Moderate air fluid levels again seen within the stomach. There is wall thickening and inflammation of the gastric antrum. There is a tiny focus of air which may represent a gastric diverticulum or microperforation on image 2/27. There is diffuse colonic diverticulosis. The proximal appendix measures 8 mm. Distal appendix appears normal. No surrounding inflammation. There is no bowel obstruction. PERITONEUM AND RETROPERITONEUM: No ascites. No free air. VASCULATURE: Aorta is normal in caliber. LYMPH NODES: No lymphadenopathy. REPRODUCTIVE ORGANS: No acute abnormality. BONES AND SOFT TISSUES: No acute osseous abnormality. No focal soft tissue abnormality. IMPRESSION: 1. Wall thickening and inflammation of the gastric antrum with a tiny focus of air, possibly representing a gastric diverticulum or microperforation from ulcer disease. 2. Mildly prominent proximal appendix measuring 8 mm with no surrounding inflammation, likely within normal limits. Please correlate clinically. 3. Diffuse colonic diverticulosis without evidence of diverticulitis. 4. Scattered pulmonary nodules measuring up to 3 mm, similar to prior, with no routine follow-up imaging recommended per Fleischner Society Guidelines. Electronically signed by: Greig Pique MD 05/06/2024 11:09 PM EST RP Workstation: [id number]    Anti-infectives: Anti-infectives (From admission, onward)    Start     Dose/Rate Route Frequency Ordered Stop   05/07/24 0300  piperacillin -tazobactam (ZOSYN ) IVPB 3.375 g        3.375 g 12.5 mL/hr over 240 Minutes Intravenous Every 8 hours 05/07/24 0215           Assessment/Plan Questionable gastric perforation with a foci of gas -abdominal exam is benign. -UGI done yesterday with no leak -allow CLD today, and regular diet in am if he tolerates CLD ok today -no surgical needs at this time.  Can consider outpatient GI follow up for EGD as outpatient. -d/w primary service   FEN - CLD VTE - SCDs ID - [email or web address]   I reviewed hospitalist notes, last 24 h vitals and pain scores, last 48 h intake and output, last 24 h labs and trends, and last 24 h imaging results.   LOS: 1 day    Burnard FORBES Banter , [facility] 05/08/2024, 9:27 AM Please see Amion for pager number during day hours 7:00am-4:30pm or 7:00am -11:30am on weekends  "

## 2024-05-08 NOTE — Progress Notes (Signed)
 " PROGRESS NOTE  Daryl Clark  DOB: 1960/12/27  PCP: Bulah Alm RAMAN, PA-C FMW:[id number]  DOA: 05/06/2024  LOS: 1 day  Hospital Day: 3  Subjective: Patient was seen and examined this afternoon. Pleasant, not in distress. Was seen by general surgery earlier. Hemodynamically stable  Brief narrative: Daryl Clark is a 64 y.o. male with PMH significant for DM2, HTN, HLD, obesity was started on Mounjaro  about 6 weeks ago and has lost about 40 pounds since then. 1/5, patient presented to the ED with worsening abdominal pain with nausea vomiting.  Reported ongoing abdominal pain for last 1 week.   CT abdomen pelvis which showed inflammation of the gastric antrum with tiny focus of air which could be either gastric diverticulum or microperforation.   General surgeon Dr. Aron and gastroenterologist Dr. Rosalie were consulted.   Admitted to [facility]   Assessment and plan: Abdominal pain  Questionable gastric microperforation  General Surgery following.   Started on burst of antibiotics and bowel rest Upper GI series on 1/6 did not show any evidence of leak. Clear liquid diet allowed for today.  Per general surgery, patient should not have an EGD this soon after perforation.  Hypertension  Was supposed to be but not taking amlodipine  at home.  Continue to monitor Continue as needed IV hydralazine .  Type 2 diabetes mellitus A1c 6 on 05/07/2024 PTA meds-Mounjaro , NPH 3-5 units daily Currently on SSI/Accu-Cheks Recent Labs  Lab 05/07/24 2056 05/07/24 2353 05/08/24 0405 05/08/24 0803 05/08/24 1152  GLUCAP 87 91 88 96 108*   Hyperlipidemia  Continue statin  Nutrition Status:         Mobility: Encourage ambulation.  Independent at baseline  PT Orders:   PT Follow up Rec:     Goals of care   Code Status: Full Code     DVT prophylaxis:  SCDs Start: 05/07/24 0331   Antimicrobials: IV Zosyn  Fluid: None Consultants: General Surgery Family Communication: None at  bedside  Status: Inpatient Level of care:  Telemetry   Patient is from: Home Needs to continue in-hospital care: Diet gradually being advanced.  On IV antibiotics Anticipated d/c to: Hopefully home in 1 to 2 days      Diet:  Diet Order             Diet regular Room service appropriate? Yes; Fluid consistency: Thin  Diet effective 0500 tomorrow           Diet clear liquid Fluid consistency: Thin  Diet effective now                   Scheduled Meds:  insulin  aspart  0-9 Units Subcutaneous Q4H   pantoprazole  (PROTONIX ) IV  40 mg Intravenous Q12H    PRN meds: hydrALAZINE , HYDROmorphone  (DILAUDID ) injection, lip balm, ondansetron  (ZOFRAN ) IV   Infusions:   piperacillin -tazobactam (ZOSYN )  IV 3.375 g (05/08/24 1021)    Antimicrobials: Anti-infectives (From admission, onward)    Start     Dose/Rate Route Frequency Ordered Stop   05/07/24 0300  piperacillin -tazobactam (ZOSYN ) IVPB 3.375 g        3.375 g 12.5 mL/hr over 240 Minutes Intravenous Every 8 hours 05/07/24 0215         Objective: Vitals:   05/08/24 1031 05/08/24 1314  BP: (!) 148/82 (!) 139/92  Pulse: 75 72  Resp: 16 16  Temp: 99.2 F (37.3 C) 99.2 F (37.3 C)  SpO2: 99% 99%    Intake/Output Summary (Last 24 hours) at  05/08/2024 1554 Last data filed at 05/08/2024 1325 Gross per 24 hour  Intake 1389.68 ml  Output --  Net 1389.68 ml   Filed Weights   05/07/24 1446 05/07/24 1448  Weight: 115.2 kg 116.1 kg   Weight change:  Body mass index is 37.8 kg/m.   Physical Exam: General exam: Pleasant, middle-aged.  Not in distress Skin: No rashes, lesions or ulcers. HEENT: Atraumatic, normocephalic, no obvious bleeding Lungs: Clear to auscultation bilaterally,  CVS: S1, S2, no murmur,   GI/Abd: Soft, mild epigastric tenderness, nondistended, bowel sound present,   CNS: Alert, awake, oriented x 3 Psychiatry: Mood appropriate Extremities: No pedal edema, no calf tenderness,   Data Review: I have  personally reviewed the laboratory data and studies available.  F/u labs ordered Unresulted Labs (From admission, onward)    None       Signed, Chapman Rota, MD [facility] 05/08/2024  "

## 2024-05-09 DIAGNOSIS — R198 Other specified symptoms and signs involving the digestive system and abdomen: Secondary | ICD-10-CM | POA: Diagnosis not present

## 2024-05-09 LAB — CBC WITH DIFFERENTIAL/PLATELET
Abs Immature Granulocytes: 0.01 K/uL (ref 0.00–0.07)
Basophils Absolute: 0 K/uL (ref 0.0–0.1)
Basophils Relative: 1 %
Eosinophils Absolute: 0 K/uL (ref 0.0–0.5)
Eosinophils Relative: 1 %
HCT: 38.1 % — ABNORMAL LOW (ref 39.0–52.0)
Hemoglobin: 12.7 g/dL — ABNORMAL LOW (ref 13.0–17.0)
Immature Granulocytes: 0 %
Lymphocytes Relative: 22 %
Lymphs Abs: 0.8 K/uL (ref 0.7–4.0)
MCH: 28 pg (ref 26.0–34.0)
MCHC: 33.3 g/dL (ref 30.0–36.0)
MCV: 83.9 fL (ref 80.0–100.0)
Monocytes Absolute: 0.7 K/uL (ref 0.1–1.0)
Monocytes Relative: 18 %
Neutro Abs: 2.1 K/uL (ref 1.7–7.7)
Neutrophils Relative %: 58 %
Platelets: 231 K/uL (ref 150–400)
RBC: 4.54 MIL/uL (ref 4.22–5.81)
RDW: 12.6 % (ref 11.5–15.5)
WBC: 3.6 K/uL — ABNORMAL LOW (ref 4.0–10.5)
nRBC: 0 % (ref 0.0–0.2)

## 2024-05-09 LAB — RESPIRATORY PANEL BY PCR

## 2024-05-09 LAB — URINALYSIS, COMPLETE (UACMP) WITH MICROSCOPIC
Bilirubin Urine: NEGATIVE
Glucose, UA: NEGATIVE mg/dL
Ketones, ur: NEGATIVE mg/dL
Leukocytes,Ua: NEGATIVE
Nitrite: NEGATIVE
Protein, ur: NEGATIVE mg/dL
Specific Gravity, Urine: 1.023 (ref 1.005–1.030)
pH: 5 (ref 5.0–8.0)

## 2024-05-09 LAB — COMPREHENSIVE METABOLIC PANEL WITH GFR
ALT: 20 U/L (ref 0–44)
AST: 26 U/L (ref 15–41)
Albumin: 4 g/dL (ref 3.5–5.0)
Alkaline Phosphatase: 43 U/L (ref 38–126)
Anion gap: 10 (ref 5–15)
BUN: 8 mg/dL (ref 8–23)
CO2: 23 mmol/L (ref 22–32)
Calcium: 8.6 mg/dL — ABNORMAL LOW (ref 8.9–10.3)
Chloride: 100 mmol/L (ref 98–111)
Creatinine, Ser: 1.19 mg/dL (ref 0.61–1.24)
GFR, Estimated: >60 mL/min
Glucose, Bld: 98 mg/dL (ref 70–99)
Potassium: 3.6 mmol/L (ref 3.5–5.1)
Sodium: 132 mmol/L — ABNORMAL LOW (ref 135–145)
Total Bilirubin: 0.5 mg/dL (ref 0.0–1.2)
Total Protein: 6.5 g/dL (ref 6.5–8.1)

## 2024-05-09 LAB — GLUCOSE, CAPILLARY
Glucose-Capillary: 101 mg/dL — ABNORMAL HIGH (ref 70–99)
Glucose-Capillary: 102 mg/dL — ABNORMAL HIGH (ref 70–99)
Glucose-Capillary: 112 mg/dL — ABNORMAL HIGH (ref 70–99)
Glucose-Capillary: 118 mg/dL — ABNORMAL HIGH (ref 70–99)
Glucose-Capillary: 127 mg/dL — ABNORMAL HIGH (ref 70–99)
Glucose-Capillary: 94 mg/dL (ref 70–99)

## 2024-05-09 LAB — LACTIC ACID, PLASMA: Lactic Acid, Venous: 1 mmol/L (ref 0.5–1.9)

## 2024-05-09 MED ORDER — ACETAMINOPHEN 325 MG PO TABS
650.0000 mg | ORAL_TABLET | Freq: Four times a day (QID) | ORAL | Status: DC | PRN
  Administered 2024-05-09: 650 mg via ORAL
  Filled 2024-05-09: qty 2

## 2024-05-09 MED ORDER — OSELTAMIVIR PHOSPHATE 75 MG PO CAPS
75.0000 mg | ORAL_CAPSULE | Freq: Two times a day (BID) | ORAL | Status: DC
  Administered 2024-05-09 – 2024-05-10 (×2): 75 mg via ORAL
  Filled 2024-05-09 (×2): qty 1

## 2024-05-09 MED ORDER — ACETAMINOPHEN 325 MG PO TABS
650.0000 mg | ORAL_TABLET | Freq: Once | ORAL | Status: AC
  Administered 2024-05-09: 650 mg via ORAL
  Filled 2024-05-09: qty 2

## 2024-05-09 MED ORDER — INSULIN ASPART 100 UNIT/ML IJ SOLN: 0.0000 [IU] | Freq: Three times a day (TID) | INTRAMUSCULAR | Status: DC

## 2024-05-09 MED ORDER — INSULIN ASPART 100 UNIT/ML IJ SOLN: 0.0000 [IU] | Freq: Every day | INTRAMUSCULAR | Status: DC

## 2024-05-09 MED ORDER — BENZONATATE 100 MG PO CAPS
100.0000 mg | ORAL_CAPSULE | Freq: Three times a day (TID) | ORAL | Status: DC
  Administered 2024-05-09 – 2024-05-10 (×4): 100 mg via ORAL
  Filled 2024-05-09 (×4): qty 1

## 2024-05-09 NOTE — Progress Notes (Signed)
 " PROGRESS NOTE  Daryl Clark  DOB: 1960/11/24  PCP: Bulah Alm RAMAN, PA-C FMW:[id number]  DOA: 05/06/2024  LOS: 2 days  Hospital Day: 4  Subjective: Patient was seen and examined this morning. Lying in bed.  Not in distress.  Wife at bedside. Today, patient sounds nasally.  Clear to auscultation but coughs on deep breathing. Had multiple episodes of low-grade fever up to 101.1 overnight.  Blood culture was sent earlier this morning.  Hemodynamically stable, breathing room air Labs from this morning with WBC count 3.6, sodium 132  Brief narrative: Daryl Clark is a 64 y.o. male with PMH significant for DM2, HTN, HLD, obesity was started on Mounjaro  about 6 weeks ago and has lost about 40 pounds since then. 1/5, patient presented to the ED with worsening abdominal pain with nausea vomiting.  Reported ongoing abdominal pain for last 1 week.   CT abdomen pelvis which showed inflammation of the gastric antrum with tiny focus of air which could be either gastric diverticulum or microperforation.   General surgeon Dr. Aron and gastroenterologist Dr. Rosalie were consulted.   Admitted to [facility]   Hospital course complicated by fever, ongoing workup  Assessment and plan: Abdominal pain  Questionable gastric microperforation  General Surgery consult appreciated.   Started on empiric antibiotics and bowel rest Upper GI series on 1/6 did not show any evidence of leak. 1/7, started on liquid diet which she was able to tolerate  1/8, general surgery followed up.  Advanced to regular diet.  Recommend antibiotics for a total of 5 days.  Recommended GI evaluation as an outpatient.  Fever Had multiple episodes of low-grade fever up to 101.1 overnight.   Blood culture was sent earlier this morning.   Hemodynamically stable, breathing room air. Today, patient sounds nasally.  Clear to auscultation but coughs on deep breathing.  I will obtain a 20 pathogen resp virus panel.  Add Tessalon   Perles scheduled Remains on IV Zosyn  since admission. Continue to monitor Recent Labs  Lab 05/03/24 1311 05/05/24 1505 05/06/24 1752 05/07/24 0401 05/09/24 0536  WBC 5.9 6.0 5.8 5.9 3.6*   Hypertension  Was supposed to be but not taking amlodipine  at home.  Continue to monitor Continue as needed IV hydralazine .  Type 2 diabetes mellitus A1c 6 on 05/07/2024 PTA meds-Mounjaro , NPH 3-5 units daily Currently only on SSI/Accu-Cheks Recent Labs  Lab 05/08/24 1614 05/08/24 2138 05/09/24 0054 05/09/24 0443 05/09/24 0738  GLUCAP 133* 98 102* 101* 94   Hyperlipidemia  Continue statin  Nutrition Status:         Mobility: Encourage ambulation.  Independent at baseline  PT Orders:   PT Follow up Rec:     Goals of care   Code Status: Full Code     DVT prophylaxis:  SCDs Start: 05/07/24 0331   Antimicrobials: IV Zosyn  Fluid: None Consultants: General Surgery Family Communication: Wife at bedside  Status: Inpatient Level of care:  Telemetry   Patient is from: Home Needs to continue in-hospital care: Hold discharge today because of fever spikes. Anticipated d/c to: Hopefully home in 1 to 2 days      Diet:  Diet Order             Diet regular Room service appropriate? Yes; Fluid consistency: Thin  Diet effective 0500 tomorrow                   Scheduled Meds:  benzonatate   100 mg Oral TID   insulin   aspart  0-9 Units Subcutaneous Q4H   pantoprazole  (PROTONIX ) IV  40 mg Intravenous Q12H    PRN meds: hydrALAZINE , HYDROmorphone  (DILAUDID ) injection, lip balm, ondansetron  (ZOFRAN ) IV   Infusions:   piperacillin -tazobactam (ZOSYN )  IV 3.375 g (05/09/24 1015)    Antimicrobials: Anti-infectives (From admission, onward)    Start     Dose/Rate Route Frequency Ordered Stop   05/07/24 0300  piperacillin -tazobactam (ZOSYN ) IVPB 3.375 g        3.375 g 12.5 mL/hr over 240 Minutes Intravenous Every 8 hours 05/07/24 0215         Objective: Vitals:    05/09/24 0442 05/09/24 0723  BP: 122/79   Pulse: 88   Resp: 18   Temp: (!) 101.1 F (38.4 C) (!) 100.6 F (38.1 C)  SpO2: 100%     Intake/Output Summary (Last 24 hours) at 05/09/2024 1102 Last data filed at 05/09/2024 0852 Gross per 24 hour  Intake 2283.13 ml  Output --  Net 2283.13 ml   Filed Weights   05/07/24 1446 05/07/24 1448  Weight: 115.2 kg 116.1 kg   Weight change:  Body mass index is 37.8 kg/m.   Physical Exam: General exam: Pleasant, middle-aged.  Not in distress Skin: No rashes, lesions or ulcers. HEENT: Atraumatic, normocephalic, no obvious bleeding Lungs: Clear to auscultation bilaterally, but coughs on deep breathing. CVS: S1, S2, no murmur,   GI/Abd: Soft, mild epigastric tenderness, nondistended, bowel sound present,   CNS: Alert, awake, oriented x 3 Psychiatry: Mood appropriate Extremities: No pedal edema, no calf tenderness,   Data Review: I have personally reviewed the laboratory data and studies available.  F/u labs ordered Unresulted Labs (From admission, onward)     Start     Ordered   05/09/24 1059  Respiratory (~20 pathogens) panel by PCR  (Respiratory panel by PCR (~20 pathogens, ~24 hr TAT)  w precautions)  Once,   R        05/09/24 1058   05/09/24 0513  Culture, blood (x 2)  BLOOD CULTURE X 2,   R (with TIMED occurrences)     Comments: INITIATE ANTIBIOTICS WITHIN 1 HOUR AFTER BLOOD CULTURES DRAWN.  If unable to obtain blood cultures, call MD immediately regarding antibiotic instructions.    05/09/24 0513   05/09/24 0503  Lactic acid, plasma  (Lactic Acid)  ONCE - STAT,   STAT        05/09/24 0513   05/09/24 0503  Urinalysis, Complete w Microscopic -Urine, Clean Catch  ONCE - URGENT,   URGENT       Question:  Specimen Source  Answer:  Urine, Clean Catch   05/09/24 0513            Signed, Chapman Rota, MD [facility] 05/09/2024  "

## 2024-05-09 NOTE — Plan of Care (Signed)
" °  Problem: Metabolic: Goal: Ability to maintain appropriate glucose levels will improve Outcome: Progressing   Problem: Clinical Measurements: Goal: Respiratory complications will improve Outcome: Progressing   Problem: Elimination: Goal: Will not experience complications related to bowel motility Outcome: Progressing   "

## 2024-05-09 NOTE — Progress Notes (Signed)
 Notified Lavanda Horns, NP that the pt had a temp of 101.1. Also shared the following information based on her questions for the nurse: Mild tenderness to abdomen, he's obese, no nausea or vomiting- same as yesterday and the day before. He has been spitting up clear secretions which he says he isn't sure where it came from. I asked him was it him coughing this up. He said no.... No shortness of breath and no coughing or sore throat reported no chills, no urinary changes. He said he had long pants on and took them off just now. He says he feels fine. Order received.

## 2024-05-09 NOTE — Progress Notes (Signed)
 "      Subjective: Patient feels well this morning.  No nausea or vomiting.  Hungry and has eaten solid food for breakfast with no issues.  He did have a temp overnight of 101.  WBC this am 3, HR normal, BP normal.  Has a mild cough, but denies dysuria.  Denies any abdominal pain at all  ROS: See above, otherwise other systems negative  Objective: Vital signs in last 24 hours: Temp:  [99.2 F (37.3 C)-101.1 F (38.4 C)] 100.6 F (38.1 C) (01/08 0723) Pulse Rate:  [70-88] 88 (01/08 0442) Resp:  [16-18] 18 (01/08 0442) BP: (117-148)/(77-92) 122/79 (01/08 0442) SpO2:  [98 %-100 %] 100 % (01/08 0442) Last BM Date : 05/07/24  Intake/Output from previous day: 01/07 0701 - 01/08 0700 In: 2405.4 [P.O.:2280; IV Piggyback:125.4] Out: -  Intake/Output this shift: No intake/output data recorded.  PE: Abd: soft, NT, ND, obese  Lab Results:  Recent Labs    05/07/24 0401 05/09/24 0536  WBC 5.9 3.6*  HGB 14.2 12.7*  HCT 41.4 38.1*  PLT 282 231   BMET Recent Labs    05/07/24 0401 05/09/24 0536  NA 138 132*  K 3.5 3.6  CL 103 100  CO2 22 23  GLUCOSE 107* 98  BUN 11 8  CREATININE 0.92 1.19  CALCIUM  8.8* 8.6*   PT/INR No results for input(s): LABPROT, INR in the last 72 hours. CMP     Component Value Date/Time   NA 132 (L) 05/09/2024 0536   NA 137 10/31/2023 1546   K 3.6 05/09/2024 0536   CL 100 05/09/2024 0536   CO2 23 05/09/2024 0536   GLUCOSE 98 05/09/2024 0536   BUN 8 05/09/2024 0536   BUN 12 10/31/2023 1546   CREATININE 1.19 05/09/2024 0536   CALCIUM  8.6 (L) 05/09/2024 0536   PROT 6.5 05/09/2024 0536   PROT 7.2 10/31/2023 1546   ALBUMIN 4.0 05/09/2024 0536   ALBUMIN 4.7 10/31/2023 1546   AST 26 05/09/2024 0536   ALT 20 05/09/2024 0536   ALKPHOS 43 05/09/2024 0536   BILITOT 0.5 05/09/2024 0536   BILITOT 0.4 10/31/2023 1546   GFRNONAA >60 05/09/2024 0536   Lipase     Component Value Date/Time   LIPASE 29 05/06/2024 1752        Studies/Results: DG UGI W SINGLE CM (SOL OR THIN BA) Result Date: 05/07/2024 CLINICAL DATA:  64 year old male. Endorsing acute onset of generalized abdominal pain and vomiting. Team is requesting upper GI for further evaluation of possible perforation EXAM: DG UGI W SINGLE CM TECHNIQUE: Scout radiograph was obtained. Single contrast examination was performed using water soluable contrast. This exam was performed by Delon Beagle NP, and was supervised and interpreted by Dr. Cordella Banner . FLUOROSCOPY: Radiation Exposure Index (as provided by the fluoroscopic device): 2.7 mGy Kerma COMPARISON:  CT abdomen pelvis dated May 06, 2024 FINDINGS: Scout Radiograph: Within normal limit Esophagus:  Normal appearance. Esophageal motility:  Within normal limits. Gastroesophageal reflux:  None visualized. Ingested 13mm barium tablet:  Not given Stomach: Persistent small ovoid area of pooling suggesting an outpouching of a diverticula. No hiatal hernia. Gastric emptying: Normal. Duodenum:  Normal appearance. Other:  None. IMPRESSION: Problem oriented water-soluble upper GI. Small focus of contrast pooling in the antrum suggestive of a small antral diverticula. Consider endoscopic for further evaluation Electronically Signed   By: Cordella Banner   On: 05/07/2024 10:34    Anti-infectives: Anti-infectives (From admission, onward)  Start     Dose/Rate Route Frequency Ordered Stop   05/07/24 0300  piperacillin -tazobactam (ZOSYN ) IVPB 3.375 g        3.375 g 12.5 mL/hr over 240 Minutes Intravenous Every 8 hours 05/07/24 0215          Assessment/Plan Questionable gastric perforation with a foci of gas after N/V at home -abdominal exam is benign. -UGI done with no leak -tolerated CLD yesterday and solid food today -unclear etiology of temp.  His abdominal exam is completely normal and he has a negative UGI and is tolerating a solid diet with no complaints.  WBC down to 3.  He  could've had a virus at home that caused his N/V and that may be contributing to a fever today, but also it's unclear.  He did recent start Mounjaro  as well, which can contribute to abdominal pain, N/V, which then led to the CT findings.  Either way we will continue to monitor his fever curve, but with a negative UGI, benign exam, and PO tolerance, I doubt any issues with stomach perforation -no surgical needs at this time.   -d/w primary service  -will make outpatient GI referral for evaluation for EGD/colonoscopy -I have sent an inbasket message to his PCP regarding concern for inpatient EGD.  FEN - regular VTE - SCDs ID - [email or web address]   I reviewed hospitalist notes, last 24 h vitals and pain scores, last 48 h intake and output, last 24 h labs and trends, and last 24 h imaging results.   LOS: 2 days    Burnard FORBES Banter , [facility] 05/09/2024, 8:29 AM Please see Amion for pager number during day hours 7:00am-4:30pm or 7:00am -11:30am on weekends  "

## 2024-05-09 NOTE — Progress Notes (Signed)
" ° ° ° °  Patient Name: Daryl Clark           DOB: 02-26-1961  MRN: [id number]      Admission Date: 05/06/2024  Attending Provider: Arlice Reichert, MD  Primary Diagnosis: Perforated viscus   Level of care: Telemetry   OVERNIGHT EVENT  New onset fever was reported reported at 5 AM, temperature 101.1 F.  This appears to have been ongoing all night, and his temperature at 9 PM was also 101.1.  Patient has been on antibiotics to cover for intra-abdominal infection due to questionable gastric microperforation.  Patient denies any shortness of breath, cough, sore throat, nasal congestion, chills.   No urinary changes-- dysuria, hematuria, malodorous urine.   Patient has mild tenderness to abdomen, but no further nausea or vomiting.    Plan: CMP, CBC, lactic acid UA Blood culture- however, patient has been on antibiotics since admission Tylenol  x 1  Tian Davison, DNP, ACNPC- AG [facility] [place]    "

## 2024-05-10 ENCOUNTER — Other Ambulatory Visit (HOSPITAL_COMMUNITY): Payer: Self-pay

## 2024-05-10 DIAGNOSIS — R198 Other specified symptoms and signs involving the digestive system and abdomen: Secondary | ICD-10-CM | POA: Diagnosis not present

## 2024-05-10 LAB — GLUCOSE, CAPILLARY
Glucose-Capillary: 106 mg/dL — ABNORMAL HIGH (ref 70–99)
Glucose-Capillary: 111 mg/dL — ABNORMAL HIGH (ref 70–99)

## 2024-05-10 MED ORDER — AMOXICILLIN-POT CLAVULANATE 875-125 MG PO TABS
1.0000 | ORAL_TABLET | Freq: Two times a day (BID) | ORAL | 0 refills | Status: AC
  Filled 2024-05-10: qty 10, 5d supply, fill #0

## 2024-05-10 MED ORDER — PANTOPRAZOLE SODIUM 40 MG PO TBEC
40.0000 mg | DELAYED_RELEASE_TABLET | Freq: Every day | ORAL | 0 refills | Status: AC
  Filled 2024-05-10: qty 30, 30d supply, fill #0

## 2024-05-10 MED ORDER — BENZONATATE 100 MG PO CAPS
100.0000 mg | ORAL_CAPSULE | Freq: Three times a day (TID) | ORAL | 0 refills | Status: AC
  Filled 2024-05-10: qty 20, 7d supply, fill #0

## 2024-05-10 MED ORDER — GUAIFENESIN ER 600 MG PO TB12
600.0000 mg | ORAL_TABLET | Freq: Two times a day (BID) | ORAL | 0 refills | Status: AC
  Filled 2024-05-10: qty 28, 14d supply, fill #0

## 2024-05-10 MED ORDER — OSELTAMIVIR PHOSPHATE 75 MG PO CAPS
75.0000 mg | ORAL_CAPSULE | Freq: Two times a day (BID) | ORAL | 0 refills | Status: AC
  Filled 2024-05-10: qty 8, 4d supply, fill #0

## 2024-05-10 MED ORDER — PANTOPRAZOLE SODIUM 40 MG PO TBEC: 40.0000 mg | DELAYED_RELEASE_TABLET | Freq: Two times a day (BID) | ORAL | Status: DC

## 2024-05-10 NOTE — Progress Notes (Signed)
 Pt's oral temp 103. Applying ice packs to body. No chills, no pain, dry, very warm to touch. Messaged Lavanda Horns, NP of temp. Orders were entered by her for Tylenol .

## 2024-05-10 NOTE — Plan of Care (Signed)
 " Problem: Education: Goal: Ability to describe self-care measures that may prevent or decrease complications (Diabetes Survival Skills Education) will improve 05/10/2024 1430 by Estelle Ashley BIRCH, RN Outcome: Adequate for Discharge 05/10/2024 1430 by Estelle Ashley BIRCH, RN Outcome: Adequate for Discharge Goal: Individualized Educational Video(s) 05/10/2024 1430 by Estelle Ashley BIRCH, RN Outcome: Adequate for Discharge 05/10/2024 1430 by Estelle Ashley BIRCH, RN Outcome: Adequate for Discharge   Problem: Coping: Goal: Ability to adjust to condition or change in health will improve 05/10/2024 1430 by Estelle Ashley BIRCH, RN Outcome: Adequate for Discharge 05/10/2024 1430 by Estelle Ashley BIRCH, RN Outcome: Adequate for Discharge   Problem: Fluid Volume: Goal: Ability to maintain a balanced intake and output will improve 05/10/2024 1430 by Estelle Ashley BIRCH, RN Outcome: Adequate for Discharge 05/10/2024 1430 by Estelle Ashley BIRCH, RN Outcome: Adequate for Discharge   Problem: Health Behavior/Discharge Planning: Goal: Ability to identify and utilize available resources and services will improve 05/10/2024 1430 by Estelle Ashley BIRCH, RN Outcome: Adequate for Discharge 05/10/2024 1430 by Estelle Ashley BIRCH, RN Outcome: Adequate for Discharge Goal: Ability to manage health-related needs will improve 05/10/2024 1430 by Estelle Ashley BIRCH, RN Outcome: Adequate for Discharge 05/10/2024 1430 by Estelle Ashley BIRCH, RN Outcome: Adequate for Discharge   Problem: Metabolic: Goal: Ability to maintain appropriate glucose levels will improve 05/10/2024 1430 by Estelle Ashley BIRCH, RN Outcome: Adequate for Discharge 05/10/2024 1430 by Estelle Ashley BIRCH, RN Outcome: Adequate for Discharge   Problem: Nutritional: Goal: Maintenance of adequate nutrition will improve 05/10/2024 1430 by Estelle Ashley BIRCH, RN Outcome: Adequate for Discharge 05/10/2024 1430 by Estelle Ashley BIRCH, RN Outcome:  Adequate for Discharge Goal: Progress toward achieving an optimal weight will improve 05/10/2024 1430 by Estelle Ashley BIRCH, RN Outcome: Adequate for Discharge 05/10/2024 1430 by Estelle Ashley BIRCH, RN Outcome: Adequate for Discharge   Problem: Skin Integrity: Goal: Risk for impaired skin integrity will decrease 05/10/2024 1430 by Estelle Ashley BIRCH, RN Outcome: Adequate for Discharge 05/10/2024 1430 by Estelle Ashley BIRCH, RN Outcome: Adequate for Discharge   Problem: Tissue Perfusion: Goal: Adequacy of tissue perfusion will improve 05/10/2024 1430 by Estelle Ashley BIRCH, RN Outcome: Adequate for Discharge 05/10/2024 1430 by Estelle Ashley BIRCH, RN Outcome: Adequate for Discharge   Problem: Education: Goal: Knowledge of General Education information will improve Description: Including pain rating scale, medication(s)/side effects and non-pharmacologic comfort measures 05/10/2024 1430 by Estelle Ashley BIRCH, RN Outcome: Adequate for Discharge 05/10/2024 1430 by Estelle Ashley BIRCH, RN Outcome: Adequate for Discharge   Problem: Health Behavior/Discharge Planning: Goal: Ability to manage health-related needs will improve 05/10/2024 1430 by Estelle Ashley BIRCH, RN Outcome: Adequate for Discharge 05/10/2024 1430 by Estelle Ashley BIRCH, RN Outcome: Adequate for Discharge   Problem: Clinical Measurements: Goal: Ability to maintain clinical measurements within normal limits will improve 05/10/2024 1430 by Estelle Ashley BIRCH, RN Outcome: Adequate for Discharge 05/10/2024 1430 by Estelle Ashley BIRCH, RN Outcome: Adequate for Discharge Goal: Will remain free from infection 05/10/2024 1430 by Estelle Ashley BIRCH, RN Outcome: Adequate for Discharge 05/10/2024 1430 by Estelle Ashley BIRCH, RN Outcome: Adequate for Discharge Goal: Diagnostic test results will improve 05/10/2024 1430 by Estelle Ashley BIRCH, RN Outcome: Adequate for Discharge 05/10/2024 1430 by Estelle Ashley BIRCH, RN Outcome:  Adequate for Discharge Goal: Respiratory complications will improve 05/10/2024 1430 by Estelle Ashley BIRCH, RN Outcome: Adequate for Discharge 05/10/2024 1430 by Estelle Ashley BIRCH, RN Outcome: Adequate for Discharge Goal: Cardiovascular complication will be avoided 05/10/2024 1430 by Estelle Ashley BIRCH, RN  Outcome: Adequate for Discharge 05/10/2024 1430 by Estelle Ashley BIRCH, RN Outcome: Adequate for Discharge   Problem: Activity: Goal: Risk for activity intolerance will decrease 05/10/2024 1430 by Estelle Ashley BIRCH, RN Outcome: Adequate for Discharge 05/10/2024 1430 by Estelle Ashley BIRCH, RN Outcome: Adequate for Discharge   Problem: Nutrition: Goal: Adequate nutrition will be maintained 05/10/2024 1430 by Estelle Ashley BIRCH, RN Outcome: Adequate for Discharge 05/10/2024 1430 by Estelle Ashley BIRCH, RN Outcome: Adequate for Discharge   Problem: Coping: Goal: Level of anxiety will decrease 05/10/2024 1430 by Estelle Ashley BIRCH, RN Outcome: Adequate for Discharge 05/10/2024 1430 by Estelle Ashley BIRCH, RN Outcome: Adequate for Discharge   Problem: Elimination: Goal: Will not experience complications related to bowel motility 05/10/2024 1430 by Estelle Ashley BIRCH, RN Outcome: Adequate for Discharge 05/10/2024 1430 by Estelle Ashley BIRCH, RN Outcome: Adequate for Discharge Goal: Will not experience complications related to urinary retention 05/10/2024 1430 by Estelle Ashley BIRCH, RN Outcome: Adequate for Discharge 05/10/2024 1430 by Estelle Ashley BIRCH, RN Outcome: Adequate for Discharge   Problem: Pain Managment: Goal: General experience of comfort will improve and/or be controlled 05/10/2024 1430 by Estelle Ashley BIRCH, RN Outcome: Adequate for Discharge 05/10/2024 1430 by Estelle Ashley BIRCH, RN Outcome: Adequate for Discharge   Problem: Safety: Goal: Ability to remain free from injury will improve 05/10/2024 1430 by Estelle Ashley BIRCH, RN Outcome: Adequate for  Discharge 05/10/2024 1430 by Estelle Ashley BIRCH, RN Outcome: Adequate for Discharge   Problem: Skin Integrity: Goal: Risk for impaired skin integrity will decrease 05/10/2024 1430 by Estelle Ashley BIRCH, RN Outcome: Adequate for Discharge 05/10/2024 1430 by Estelle Ashley BIRCH, RN Outcome: Adequate for Discharge   "

## 2024-05-10 NOTE — Progress Notes (Signed)
 "      Subjective: Patient feels well this morning, despite finding out he has the flu.  He did spike a temp overnight to 103.  He is still eating great with no abdominal pain.  ROS: See above, otherwise other systems negative  Objective: Vital signs in last 24 hours: Temp:  [99.5 F (37.5 C)-103 F (39.4 C)] 99.5 F (37.5 C) (01/09 9386) Pulse Rate:  [83-101] 89 (01/09 0613) Resp:  [15-18] 17 (01/09 0613) BP: (112-134)/(65-88) 134/88 (01/09 0613) SpO2:  [96 %-100 %] 97 % (01/09 0613) Last BM Date : 05/09/24  Intake/Output from previous day: 01/08 0701 - 01/09 0700 In: 1476.2 [P.O.:1320; IV Piggyback:156.2] Out: -  Intake/Output this shift: No intake/output data recorded.  PE: Abd: soft, NT, ND, obese  Lab Results:  Recent Labs    05/09/24 0536  WBC 3.6*  HGB 12.7*  HCT 38.1*  PLT 231   BMET Recent Labs    05/09/24 0536  NA 132*  K 3.6  CL 100  CO2 23  GLUCOSE 98  BUN 8  CREATININE 1.19  CALCIUM  8.6*   PT/INR No results for input(s): LABPROT, INR in the last 72 hours. CMP     Component Value Date/Time   NA 132 (L) 05/09/2024 0536   NA 137 10/31/2023 1546   K 3.6 05/09/2024 0536   CL 100 05/09/2024 0536   CO2 23 05/09/2024 0536   GLUCOSE 98 05/09/2024 0536   BUN 8 05/09/2024 0536   BUN 12 10/31/2023 1546   CREATININE 1.19 05/09/2024 0536   CALCIUM  8.6 (L) 05/09/2024 0536   PROT 6.5 05/09/2024 0536   PROT 7.2 10/31/2023 1546   ALBUMIN 4.0 05/09/2024 0536   ALBUMIN 4.7 10/31/2023 1546   AST 26 05/09/2024 0536   ALT 20 05/09/2024 0536   ALKPHOS 43 05/09/2024 0536   BILITOT 0.5 05/09/2024 0536   BILITOT 0.4 10/31/2023 1546   GFRNONAA >60 05/09/2024 0536   Lipase     Component Value Date/Time   LIPASE 29 05/06/2024 1752       Studies/Results: No results found.   Anti-infectives: Anti-infectives (From admission, onward)    Start     Dose/Rate Route Frequency Ordered Stop   05/09/24 1645  oseltamivir  (TAMIFLU ) capsule 75 mg         75 mg Oral 2 times daily 05/09/24 1636 05/14/24 2159   05/07/24 0300  piperacillin -tazobactam (ZOSYN ) IVPB 3.375 g        3.375 g 12.5 mL/hr over 240 Minutes Intravenous Every 8 hours 05/07/24 0215          Assessment/Plan Questionable gastric perforation with a foci of gas after N/V at home -abdominal exam is benign. -UGI done with no leak -tolerating regular diet. -patient has been found to have Flu A.  Fevers likely to be from this and not his abdomen. -no surgical needs at this time.   -d/w primary service  -rec 5 days total of abx therapy -outpatient GI referral for evaluation for EGD/colonoscopy has been made -I have sent an inbasket message to his PCP regarding concern for inpatient EGD yesterday. -surgically stable for DC home  FEN - regular VTE - SCDs ID - [email or web address]   I reviewed hospitalist notes, last 24 h vitals and pain scores, last 48 h intake and output, last 24 h labs and trends, and last 24 h imaging results.   LOS: 3 days    Burnard FORBES Banter , [facility] Surgery 05/10/2024, 9:44 AM Please  see Amion for pager number during day hours 7:00am-4:30pm or 7:00am -11:30am on weekends  "

## 2024-05-10 NOTE — Discharge Summary (Signed)
 "  Physician Discharge Summary  Daryl Clark FMW:[id number] DOB: 1960-06-12 DOA: 05/06/2024  PCP: Bulah Alm RAMAN, PA-C  Admit date: 05/06/2024 Discharge date: 05/10/2024  Admitted from: Home Discharge disposition: Home  Recommendations at discharge:  Complete the course of antibiotics with 3 more days of oral Augmentin  at home. Continue to Protonix  daily.  GI evaluation as an outpatient. Complete the course of Tamiflu  at home. Tessalon  Perles and Mucinex  for cough, Tylenol  for fever   Subjective: Patient was seen and examined this morning.   He had fever up to 103 last night, otherwise hemodynamically stable and breathing on room air Lying on bed.  Feels better than yesterday.  Wife at bedside. Patient and wife are both hoping for discharge today We discussed about the fever he had last night.  Patient feels better this morning and would like to rest at home.  Brief narrative: Daryl Clark is a 64 y.o. male with PMH significant for DM2, HTN, HLD, obesity was started on Mounjaro  about 6 weeks ago and has lost about 40 pounds since then. 1/5, patient presented to the ED with worsening abdominal pain with nausea vomiting.  Reported ongoing abdominal pain for last 1 week.   CT abdomen pelvis which showed inflammation of the gastric antrum with tiny focus of air which could be either gastric diverticulum or microperforation.   General surgeon Dr. Aron and gastroenterologist Dr. Rosalie were consulted.   Admitted to [facility]   Hospital course complicated by influenza A  Hospital course: Abdominal pain  Questionable gastric microperforation  General Surgery consult appreciated.   Started on empiric antibiotics and bowel rest Upper GI series on 1/6 did not show any evidence of leak. 1/7, started on liquid diet which she was able to tolerate  1/8, general surgery followed up.  Advanced to regular diet.  Recommend antibiotics for a total of 5 days.  To complete the course with 3 more  days of oral Augmentin  at home.Recommended GI evaluation as an outpatient. Continue PPI daily  Influenza A positive On the third day of hospitalization, patient started having multiple episodes of fever as high as 103. Blood culture was collected, did not show any growth. 1/8, resp virus panel positive for influenza A.  He has been started on Tamiflu , Tessalon  Perles, Mucinex . Respiratory status stable and breathing on room air. Had fever spike last night.  Feels better this morning.  Patient and wife feel that they would feel more comfortable to rest at home. Will discharge him to complete course of Tamiflu  at home.  Also given prescription for Tessalon  Perles and Mucinex . Recent Labs  Lab 05/03/24 1311 05/05/24 1505 05/06/24 1752 05/07/24 0401 05/09/24 0536 05/09/24 1935  WBC 5.9 6.0 5.8 5.9 3.6*  --   LATICACIDVEN  --   --   --   --   --  1.0   Hypertension  Was supposed to be but not taking amlodipine  at home.  Currently blood pressure normal and stable without meds.  Type 2 diabetes mellitus A1c 6 on 05/07/2024 PTA meds-Mounjaro , NPH 3-5 units daily Continue the same. Recent Labs  Lab 05/09/24 0738 05/09/24 1113 05/09/24 1604 05/09/24 2115 05/10/24 0741  GLUCAP 94 127* 112* 118* 106*   Hyperlipidemia  Continue statin  Goals of care   Code Status: Full Code   Diet:  Diet Order             Diet general           Diet regular  Room service appropriate? Yes; Fluid consistency: Thin  Diet effective 0500 tomorrow                   Nutritional status:  Body mass index is 37.8 kg/m.       Wounds:  -    Discharge Medications:   Allergies as of 05/10/2024       Reactions   Losartan  Potassium-hctz Other (See Comments)   Cramps a lot         Medication List     STOP taking these medications    amlodipine -atorvastatin 10-20 MG tablet Commonly known as: CADUET   amlodipine -olmesartan  10-20 MG tablet Commonly known as: AZOR        TAKE  these medications    acetaminophen  500 MG tablet Commonly known as: TYLENOL  Take 500 mg by mouth every 6 (six) hours as needed.   amoxicillin -clavulanate 875-125 MG tablet Commonly known as: AUGMENTIN  Take 1 tablet by mouth 2 (two) times daily for 5 days.   BD Pen Needle Nano U/F 32G X 4 MM Misc Generic drug: Insulin  Pen Needle 1 each by Does not apply route at bedtime.   benzonatate  100 MG capsule Commonly known as: TESSALON  Take 1 capsule (100 mg total) by mouth 3 (three) times daily.   Blood Glucose Monitoring Suppl Devi 1 each by Does not apply route in the morning, at noon, and at bedtime. May substitute to any manufacturer covered by patient's insurance.   dicyclomine  20 MG tablet Commonly known as: BENTYL  Take 1 tablet (20 mg total) by mouth 2 (two) times daily.   guaiFENesin  600 MG 12 hr tablet Commonly known as: MUCINEX  Take 1 tablet (600 mg total) by mouth 2 (two) times daily for 14 days.   ibuprofen 200 MG tablet Commonly known as: ADVIL Take 200 mg by mouth every 6 (six) hours as needed.   insulin  NPH Human 100 UNIT/ML injection Commonly known as: HumuLIN N Inject 0.08 mLs (8 Units total) into the skin daily before breakfast. What changed: how much to take   metoCLOPramide  10 MG tablet Commonly known as: REGLAN  Take 1 tablet (10 mg total) by mouth every 6 (six) hours.   Mounjaro  2.5 MG/0.5ML Pen Generic drug: tirzepatide  INJECT 1/2 (ONE-HALF) ML SUBCUTANEOUSLY  ONCE A WEEK   ondansetron  4 MG tablet Commonly known as: ZOFRAN  Take 1 tablet (4 mg total) by mouth every 6 (six) hours.   ondansetron  8 MG disintegrating tablet Commonly known as: ZOFRAN -ODT Take 1 tablet (8 mg total) by mouth every 8 (eight) hours as needed for nausea or vomiting.   oseltamivir  75 MG capsule Commonly known as: TAMIFLU  Take 1 capsule (75 mg total) by mouth 2 (two) times daily for 4 days.   pantoprazole  40 MG tablet Commonly known as: PROTONIX  Take 1 tablet (40 mg total)  by mouth daily before breakfast.   promethazine  25 MG suppository Commonly known as: PHENERGAN  Place 1 suppository (25 mg total) rectally every 6 (six) hours as needed for nausea or vomiting.   rosuvastatin  10 MG tablet Commonly known as: Crestor  Take 1 tablet (10 mg total) by mouth daily.         Follow ups:    Follow-up Information     [facility] Follow up.   Specialty: Gastroenterology Why: Their office will call you with follow up for upper endoscopy Contact information: [address] [place]  [id number] [phone]        Bulah Alm RAMAN, PA-C Follow up.  Specialty: Family Medicine Contact information: [address] [place] [address] [phone]                 Discharge Instructions:   Discharge Instructions     Ambulatory referral to Gastroenterology   Complete by: As directed    Gastric microperf following N/V, UGI with gastric diverticula, outpatient rec for EGD, and never had a c-scope   What is the reason for referral?: Other Comment - EGD, colonoscopy   Call MD for:  difficulty breathing, headache or visual disturbances   Complete by: As directed    Call MD for:  extreme fatigue   Complete by: As directed    Call MD for:  hives   Complete by: As directed    Call MD for:  persistant dizziness or light-headedness   Complete by: As directed    Call MD for:  persistant nausea and vomiting   Complete by: As directed    Call MD for:  severe uncontrolled pain   Complete by: As directed    Call MD for:  temperature >100.4   Complete by: As directed    Diet general   Complete by: As directed    Discharge instructions   Complete by: As directed    Recommendations at discharge:   Complete the course of antibiotics with 3 more days of oral Augmentin  at home.  Continue to Protonix  daily.  GI evaluation as an outpatient.  Complete the course of Tamiflu  at home.  Tessalon  Perles  and Mucinex  for cough, Tylenol  for fever  General discharge instructions: Follow with Primary MD Tysinger, Alm RAMAN, PA-C in 7 days  Please request your PCP  to go over your hospital tests, procedures, radiology results at the follow up. Please get your medicines reviewed and adjusted.  Your PCP may decide to repeat certain labs or tests as needed. Do not drive, operate heavy machinery, perform activities at heights, swimming or participation in water activities or provide baby sitting services if your were admitted for syncope or siezures until you have seen by Primary MD or a Neurologist and advised to do so again. [place]  Controlled Substance Reporting System database was reviewed. Do not drive, operate heavy machinery, perform activities at heights, swim, participate in water activities or provide baby-sitting services while on medications for pain, sleep and mood until your outpatient physician has reevaluated you and advised to do so again.  You are strongly recommended to comply with the dose, frequency and duration of prescribed medications. Activity: As tolerated with Full fall precautions use walker/cane & assistance as needed Avoid using any recreational substances like cigarette, tobacco, alcohol, or non-prescribed drug. If you experience worsening of your admission symptoms, develop shortness of breath, life threatening emergency, suicidal or homicidal thoughts you must seek medical attention immediately by calling 911 or calling your MD immediately  if symptoms less severe. You must read complete instructions/literature along with all the possible adverse reactions/side effects for all the medicines you take and that have been prescribed to you. Take any new medicine only after you have completely understood and accepted all the possible adverse reactions/side effects.  Wear Seat belts while driving. You were cared for by a hospitalist during your hospital stay. If you have any  questions about your discharge medications or the care you received while you were in the hospital after you are discharged, you can call the unit and ask to speak with the hospitalist or the covering physician. Once you are discharged,  your primary care physician will handle any further medical issues. Please note that NO REFILLS for any discharge medications will be authorized once you are discharged, as it is imperative that you return to your primary care physician (or establish a relationship with a primary care physician if you do not have one).   Increase activity slowly   Complete by: As directed        Discharge Exam:   Vitals:   05/10/24 0228 05/10/24 0357 05/10/24 0613 05/10/24 1127  BP: 133/88  134/88 124/89  Pulse: 85  89 85  Resp: 16  17   Temp: 100.2 F (37.9 C) 100.2 F (37.9 C) 99.5 F (37.5 C) 99.5 F (37.5 C)  TempSrc: Oral Oral Oral Oral  SpO2: 100%  97% 98%  Weight:      Height:        Body mass index is 37.8 kg/m.  General exam: Pleasant, middle-aged.  Not in distress Skin: No rashes, lesions or ulcers. HEENT: Atraumatic, normocephalic, no obvious bleeding Lungs: Clear to auscultation bilaterally, but coughs on deep breathing. CVS: S1, S2, no murmur,   GI/Abd: Soft, improved abdominal tenderness, nondistended, bowel sound present,   CNS: Alert, awake, oriented x 3 Psychiatry: Mood appropriate Extremities: No pedal edema, no calf tenderness,    The results of significant diagnostics from this hospitalization (including imaging, microbiology, ancillary and laboratory) are listed below for reference.    Procedures and Diagnostic Studies:   DG UGI W SINGLE CM (SOL OR THIN BA) Result Date: 05/07/2024 CLINICAL DATA:  64 year old male. Endorsing acute onset of generalized abdominal pain and vomiting. Team is requesting upper GI for further evaluation of possible perforation EXAM: DG UGI W SINGLE CM TECHNIQUE: Scout radiograph was obtained. Single  contrast examination was performed using water soluable contrast. This exam was performed by Delon Beagle NP, and was supervised and interpreted by Dr. Cordella Banner . FLUOROSCOPY: Radiation Exposure Index (as provided by the fluoroscopic device): 2.7 mGy Kerma COMPARISON:  CT abdomen pelvis dated May 06, 2024 FINDINGS: Scout Radiograph: Within normal limit Esophagus:  Normal appearance. Esophageal motility:  Within normal limits. Gastroesophageal reflux:  None visualized. Ingested 13mm barium tablet:  Not given Stomach: Persistent small ovoid area of pooling suggesting an outpouching of a diverticula. No hiatal hernia. Gastric emptying: Normal. Duodenum:  Normal appearance. Other:  None. IMPRESSION: Problem oriented water-soluble upper GI. Small focus of contrast pooling in the antrum suggestive of a small antral diverticula. Consider endoscopic for further evaluation Electronically Signed   By: Cordella Banner   On: 05/07/2024 10:34   CT ABDOMEN PELVIS W CONTRAST Result Date: 05/06/2024 EXAM: CT ABDOMEN AND PELVIS WITH CONTRAST 05/06/2024 10:57:00 PM TECHNIQUE: CT of the abdomen and pelvis was performed with the administration of 100 mL of iohexol  (OMNIPAQUE ) 300 MG/ML solution. Multiplanar reformatted images are provided for review. Automated exposure control, iterative reconstruction, and/or weight-based adjustment of the mA/kV was utilized to reduce the radiation dose to as low as reasonably achievable. COMPARISON: CT abdomen and pelvis 05/03/2024. CLINICAL HISTORY: Abdominal pain, acute, nonlocalized; Bowel obstruction suspected. FINDINGS: LOWER CHEST: There is scattered pulmonary nodules measuring 3 mm or less. Similar to prior. LIVER: The liver is unremarkable. GALLBLADDER AND BILE DUCTS: Gallbladder is unremarkable. No biliary ductal dilatation. SPLEEN: No acute abnormality. PANCREAS: No acute abnormality. ADRENAL GLANDS: No acute abnormality. KIDNEYS, URETERS AND BLADDER: There is a punctate  calculus in the inferior pole of the left kidney. No stones in the ureters. No hydronephrosis.  No perinephric or periureteral stranding. Urinary bladder is unremarkable. GI AND BOWEL: Moderate air fluid levels again seen within the stomach. There is wall thickening and inflammation of the gastric antrum. There is a tiny focus of air which may represent a gastric diverticulum or microperforation on image 2/27. There is diffuse colonic diverticulosis. The proximal appendix measures 8 mm. Distal appendix appears normal. No surrounding inflammation. There is no bowel obstruction. PERITONEUM AND RETROPERITONEUM: No ascites. No free air. VASCULATURE: Aorta is normal in caliber. LYMPH NODES: No lymphadenopathy. REPRODUCTIVE ORGANS: No acute abnormality. BONES AND SOFT TISSUES: No acute osseous abnormality. No focal soft tissue abnormality. IMPRESSION: 1. Wall thickening and inflammation of the gastric antrum with a tiny focus of air, possibly representing a gastric diverticulum or microperforation from ulcer disease. 2. Mildly prominent proximal appendix measuring 8 mm with no surrounding inflammation, likely within normal limits. Please correlate clinically. 3. Diffuse colonic diverticulosis without evidence of diverticulitis. 4. Scattered pulmonary nodules measuring up to 3 mm, similar to prior, with no routine follow-up imaging recommended per Fleischner Society Guidelines. Electronically signed by: Greig Pique MD 05/06/2024 11:09 PM EST RP Workstation: [id number]     Labs:   Basic Metabolic Panel: Recent Labs  Lab 05/03/24 1311 05/03/24 1331 05/05/24 1505 05/06/24 1752 05/07/24 0401 05/09/24 0536  NA 138 140 139 137 138 132*  K 3.4* 3.3* 3.3* 3.6 3.5 3.6  CL 99 100 102 102 103 100  CO2 24  --  23 23 22 23   GLUCOSE 147* 134* 134* 138* 107* 98  BUN 13 13 13 12 11 8   CREATININE 1.28* 1.40* 1.17 1.05 0.92 1.19  CALCIUM  9.7  --  8.9 9.1 8.8* 8.6*   GFR Estimated Creatinine Clearance: 79.9 mL/min  (by C-G formula based on SCr of 1.19 mg/dL). Liver Function Tests: Recent Labs  Lab 05/03/24 1311 05/05/24 1505 05/06/24 1752 05/07/24 0401 05/09/24 0536  AST 39 35 28 23 26   ALT 34 27 28 24 20   ALKPHOS 62 57 54 46 43  BILITOT 0.8 0.7 0.8 0.6 0.5  PROT 8.1 7.6 7.8 7.1 6.5  ALBUMIN 5.0 4.9 4.6 4.4 4.0   Recent Labs  Lab 05/03/24 1311 05/05/24 1505 05/06/24 1752  LIPASE 34 88* 29   No results for input(s): AMMONIA in the last 168 hours. Coagulation profile No results for input(s): INR, PROTIME in the last 168 hours.  CBC: Recent Labs  Lab 05/03/24 1311 05/03/24 1331 05/05/24 1505 05/06/24 1752 05/07/24 0401 05/09/24 0536  WBC 5.9  --  6.0 5.8 5.9 3.6*  NEUTROABS 3.1  --   --  2.5 2.9 2.1  HGB 16.2 17.0 15.4 15.2 14.2 12.7*  HCT 47.6 50.0 46.0 45.4 41.4 38.1*  MCV 82.9  --  83.5 83.6 82.6 83.9  PLT 312  --  332 314 282 231   Cardiac Enzymes: No results for input(s): CKTOTAL, CKMB, CKMBINDEX, TROPONINI in the last 168 hours. BNP: Invalid input(s): POCBNP CBG: Recent Labs  Lab 05/09/24 0738 05/09/24 1113 05/09/24 1604 05/09/24 2115 05/10/24 0741  GLUCAP 94 127* 112* 118* 106*   D-Dimer No results for input(s): DDIMER in the last 72 hours. Hgb A1c No results for input(s): HGBA1C in the last 72 hours. Lipid Profile No results for input(s): CHOL, HDL, LDLCALC, TRIG, CHOLHDL, LDLDIRECT in the last 72 hours. Thyroid function studies No results for input(s): TSH, T4TOTAL, T3FREE, THYROIDAB in the last 72 hours.  Invalid input(s): FREET3 Anemia work up No results for input(s): VITAMINB12, FOLATE, FERRITIN,  TIBC, IRON, RETICCTPCT in the last 72 hours. Microbiology Recent Results (from the past 240 hours)  Urine Culture     Status: None   Collection Time: 05/06/24  4:57 PM   Specimen: Urine  Result Value Ref Range Status   MICRO NUMBER: [id number]  Final   SPECIMEN QUALITY: Adequate  Final   Sample  Source URINE  Final   STATUS: FINAL  Final   Result: No Growth  Final  Culture, blood (x 2)     Status: None (Preliminary result)   Collection Time: 05/09/24  5:36 AM   Specimen: BLOOD LEFT ARM  Result Value Ref Range Status   Specimen Description BLOOD LEFT ARM  Final   Special Requests   Final    AEROBIC BOTTLE ONLY Blood Culture results may not be optimal due to an inadequate volume of blood received in culture bottles   Culture   Final    NO GROWTH < 24 HOURS Performed at [facility], [address] [place], [place] [address]    Report Status PENDING  Incomplete  Culture, blood (x 2)     Status: None (Preliminary result)   Collection Time: 05/09/24  5:43 AM   Specimen: BLOOD LEFT HAND  Result Value Ref Range Status   Specimen Description BLOOD LEFT HAND  Final   Special Requests   Final    AEROBIC BOTTLE ONLY Blood Culture results may not be optimal due to an inadequate volume of blood received in culture bottles   Culture   Final    NO GROWTH < 24 HOURS Performed at [facility], [address], [place], [place] [address]    Report Status PENDING  Incomplete  Respiratory (~20 pathogens) panel by PCR     Status: Abnormal   Collection Time: 05/09/24 12:35 PM   Specimen: Nasopharyngeal Swab; Respiratory  Result Value Ref Range Status   Adenovirus NOT DETECTED NOT DETECTED Final   Coronavirus 229E NOT DETECTED NOT DETECTED Final    Comment: (NOTE) The Coronavirus on the Respiratory Panel, DOES NOT test for the novel  Coronavirus (2019 nCoV)    Coronavirus HKU1 NOT DETECTED NOT DETECTED Final   Coronavirus NL63 NOT DETECTED NOT DETECTED Final   Coronavirus OC43 NOT DETECTED NOT DETECTED Final   Metapneumovirus NOT DETECTED NOT DETECTED Final   Rhinovirus / Enterovirus NOT DETECTED NOT DETECTED Final   Influenza A H3 DETECTED (A) NOT DETECTED Final   Influenza B NOT DETECTED NOT DETECTED Final   Parainfluenza Virus 1 NOT DETECTED NOT DETECTED Final    Parainfluenza Virus 2 NOT DETECTED NOT DETECTED Final   Parainfluenza Virus 3 NOT DETECTED NOT DETECTED Final   Parainfluenza Virus 4 NOT DETECTED NOT DETECTED Final   Respiratory Syncytial Virus NOT DETECTED NOT DETECTED Final   Bordetella pertussis NOT DETECTED NOT DETECTED Final   Bordetella Parapertussis NOT DETECTED NOT DETECTED Final   Chlamydophila pneumoniae NOT DETECTED NOT DETECTED Final   Mycoplasma pneumoniae NOT DETECTED NOT DETECTED Final    Comment: Performed at [facility], [address], [place], [place] [address]    Time coordinating discharge: 45 minutes  Signed: Donaven Criswell  [facility] 05/10/2024, 11:54 AM   "

## 2024-05-10 NOTE — Progress Notes (Signed)
 Discharge meds in a secure bag delivered to patient by this RN

## 2024-05-10 NOTE — Progress Notes (Signed)
Nurse reviewed discharge instructions with pt.  Pt verbalized understanding of discharge instructions, follow up appointments and new medications.  No concerns at time of discharge. 

## 2024-05-13 ENCOUNTER — Ambulatory Visit: Payer: Self-pay | Admitting: Medical

## 2024-05-13 ENCOUNTER — Telehealth: Payer: Self-pay

## 2024-05-13 NOTE — Progress Notes (Signed)
 He had a recent hospital visit.  Blood cultures currently show no infection in the bloodstream thankfully

## 2024-05-13 NOTE — Transitions of Care (Post Inpatient/ED Visit) (Signed)
" ° °  05/13/2024  Name: Daryl Clark MRN: [id number] DOB: Feb 07, 1961  Today's TOC FU Call Status: Today's TOC FU Call Status:: Successful TOC FU Call Completed TOC FU Call Complete Date: 05/13/24  Patient's Name and Date of Birth confirmed. DOB, Name  Transition Care Management Follow-up Telephone Call Date of Discharge: 05/10/24 Discharge Facility: [facility]) Type of Discharge: Inpatient Admission Primary Inpatient Discharge Diagnosis:: Perforated Viscus How have you been since you were released from the hospital?: Better Any questions or concerns?: Yes Patient Questions/Concerns:: Patient plans to seek care with a new pcp Patient Questions/Concerns Addressed: Notified Provider of Patient Questions/Concerns  Items Reviewed:    Medications Reviewed Today: Medications Reviewed Today   Medications were not reviewed in this encounter     Home Care and Equipment/Supplies:    Functional Questionnaire:    Follow up appointments reviewed:      SIGNATURE Charmaine Bloodgood, LPN [facility] Advisor [place] [facility] You Are. We Are. [facility] Direct Dial [phone]  "

## 2024-05-14 LAB — CULTURE, BLOOD (ROUTINE X 2)
Culture: NO GROWTH
Culture: NO GROWTH

## 2024-05-14 NOTE — Progress Notes (Incomplete)
 "   Patient Care Team: Tysinger, Alm GORMAN RIGGERS as PCP - General (Family Medicine)  Visit Date: 05/14/2024  Subjective:    Patient ID: Daryl Clark , Male   DOB: 15-Jul-1960, 64 y.o.    MRN: [id number]   64 y.o. Male presents today for Hospital follow up. Patient has a past medical history of ***.  who presented to the ER because of worsening abdominal pain with nausea vomiting. Patient has been having abdominal pain for last 1 week. This is the third visit to the ER in the last 1 week. Pain is mostly in the epigastric area and lower quadrants. Has had multiple episodes of nausea and vomiting. Last bowel movement was almost a week ago. Patient was started on Mounjaro  about 6 weeks ago and has lost about 40 pounds per patient.    In the ER patient had a CT abdomen pelvis which shows inflammation of the gastric antrum with tiny focus of air which could be either gastric diverticulum or microperforation. General surgeon Dr. Aron and gastroenterologist Dr. Rosalie was consulted. Patient admitted for further observation and management. Labs showed WBC 5.8 AST and ALT were 29/28 creatinine 1.05.    General Surgery consult appreciated.   Started on empiric antibiotics and bowel rest Upper GI series on 1/6 did not show any evidence of leak. 1/7, started on liquid diet which she was able to tolerate  1/8, general surgery followed up.  Advanced to regular diet.  Recommend antibiotics for a total of 5 days.  To complete the course with 3 more days of oral Augmentin  at home.Recommended GI evaluation as an outpatient. Continue PPI daily   On the third day of hospitalization, patient started having multiple episodes of fever as high as 103. Blood culture was collected, did not show any growth. 1/8, resp virus panel positive for influenza A.  He has been started on Tamiflu , Tessalon  Perles, Mucinex . Respiratory status stable and breathing on room air. Had fever spike last night.  Feels better this  morning.  Patient and wife feel that they would feel more comfortable to rest at home. Will discharge him to complete course of Tamiflu  at home.  Also given prescription for Tessalon  Perles and Mucinex .   Past Medical History:  Diagnosis Date   Diabetes (HCC)    Hypertension 2019   Obesity      Family History  Problem Relation Age of Onset   Cirrhosis Mother    Other Father        died of natural causes   Heart disease Neg Hx    Cancer Neg Hx    Diabetes Neg Hx     Social History   Social History Narrative   Patient lives at home with his spouse.  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  Exercise - none.  10/2023      ROS      Objective:   Vitals: There were no vitals taken for this visit.   Physical Exam    Results:   Studies obtained and personally reviewed by me:  Imaging, colonoscopy, mammogram, bone density scan, echocardiogram, heart cath, stress test, CT calcium  score, etc. ***   Labs:       Component Value Date/Time   NA 132 (L) 05/09/2024 0536   NA 137 10/31/2023 1546   K 3.6 05/09/2024 0536   CL 100 05/09/2024 0536   CO2 23 05/09/2024 0536   GLUCOSE 98 05/09/2024 0536   BUN 8 05/09/2024 0536   BUN 12  10/31/2023 1546   CREATININE 1.19 05/09/2024 0536   CALCIUM  8.6 (L) 05/09/2024 0536   PROT 6.5 05/09/2024 0536   PROT 7.2 10/31/2023 1546   ALBUMIN 4.0 05/09/2024 0536   ALBUMIN 4.7 10/31/2023 1546   AST 26 05/09/2024 0536   ALT 20 05/09/2024 0536   ALKPHOS 43 05/09/2024 0536   BILITOT 0.5 05/09/2024 0536   BILITOT 0.4 10/31/2023 1546   GFRNONAA >60 05/09/2024 0536     Lab Results  Component Value Date   WBC 3.6 (L) 05/09/2024   HGB 12.7 (L) 05/09/2024   HCT 38.1 (L) 05/09/2024   MCV 83.9 05/09/2024   PLT 231 05/09/2024    Lab Results  Component Value Date   CHOL 196 10/31/2023   HDL 49 10/31/2023   LDLCALC 126 (H) 10/31/2023   TRIG 115 10/31/2023   CHOLHDL 4.0 10/31/2023    Lab Results  Component Value Date   HGBA1C 6.0 (H)  05/07/2024     Lab Results  Component Value Date   TSH 3.170 10/31/2023     Lab Results  Component Value Date   PSA1 6.1 (H) 03/25/2024   PSA1 5.5 (H) 10/31/2023   *** delete for male pts  ***    Assessment & Plan:   ***    I,Makayla C Reid,acting as a scribe for Ronal JINNY Hailstone, MD.,have documented all relevant documentation on the behalf of Ronal JINNY Hailstone, MD,as directed by  Ronal JINNY Hailstone, MD while in the presence of Ronal JINNY Hailstone, MD.   ***   "

## 2024-05-14 NOTE — Progress Notes (Signed)
 Final culture result shows no infection in the bloodstream

## 2024-05-16 ENCOUNTER — Inpatient Hospital Stay: Admitting: Internal Medicine

## 2024-05-16 ENCOUNTER — Ambulatory Visit: Admitting: Internal Medicine

## 2024-05-16 ENCOUNTER — Encounter: Payer: Self-pay | Admitting: Internal Medicine

## 2024-05-16 VITALS — BP 140/90 | HR 84 | Temp 98.4°F | Ht 69.0 in | Wt 259.0 lb

## 2024-05-16 DIAGNOSIS — I10 Essential (primary) hypertension: Secondary | ICD-10-CM | POA: Diagnosis not present

## 2024-05-16 DIAGNOSIS — Z09 Encounter for follow-up examination after completed treatment for conditions other than malignant neoplasm: Secondary | ICD-10-CM

## 2024-05-16 DIAGNOSIS — E785 Hyperlipidemia, unspecified: Secondary | ICD-10-CM

## 2024-05-16 DIAGNOSIS — E119 Type 2 diabetes mellitus without complications: Secondary | ICD-10-CM | POA: Diagnosis not present

## 2024-05-16 DIAGNOSIS — Z794 Long term (current) use of insulin: Secondary | ICD-10-CM

## 2024-05-16 DIAGNOSIS — Z7985 Long-term (current) use of injectable non-insulin antidiabetic drugs: Secondary | ICD-10-CM | POA: Diagnosis not present

## 2024-05-16 DIAGNOSIS — K255 Chronic or unspecified gastric ulcer with perforation: Secondary | ICD-10-CM

## 2024-05-16 DIAGNOSIS — R1084 Generalized abdominal pain: Secondary | ICD-10-CM | POA: Diagnosis not present

## 2024-05-16 DIAGNOSIS — J101 Influenza due to other identified influenza virus with other respiratory manifestations: Secondary | ICD-10-CM

## 2024-05-16 DIAGNOSIS — E118 Type 2 diabetes mellitus with unspecified complications: Secondary | ICD-10-CM

## 2024-05-16 NOTE — Progress Notes (Addendum)
 "   Patient Care Team: Perri Ronal PARAS, MD as PCP - General (Internal Medicine)  Visit Date: 05/16/24  Subjective:    Patient ID: Daryl Clark , Male   DOB: November 24, 1960, 64 y.o.    MRN: [id number]   64 y.o. Male presents today for Hospital follow up. Patient has a past medical history of  Hypertension, Diabetes mellitus, Type II, Hyperlipidemia .   He presented to the ER  January 5  at my direction because of worsening abdominal pain with nausea and  vomiting after evaluation urgently at this office. He had  been to the ED on jan. 2 and January 4 respectively and discharged home. He  has been having abdominal pain for  1 week. This was the third visit to the ED in  1 week.. At the time of my initial evaluation, abdominal was mostly in the epigastric area and lower abdomen. Has had multiple episodes of nausea and vomiting. Last bowel movement was almost a week prior to his admission. Patient was started on Mounjaro  about 6 weeks ago and had lost about 40 pounds according to  patient.   He had a CT abdomen and  pelvis showing inflamed gastric antrum with tiny focus of air thought to be gastric diverticulum or microperforation., General surgeon , Dr. Aron and Gastroenterologist Dr. Rosalie was consulted. Patient admitted for further observation and management. Labs showed WBC 5.8, AST and ALT were 29/28, creatinine 1.05.  On the third day of hospitalization, patient started having multiple episodes of fever as high as 103 degrees. Blood culture was collected, did not show any growth. On Jan 8th respiratory virus panel  was positive for Influenza A.  He was started on Tamiflu , Tessalon  Perles, Mucinex . Upper GI  on 1/6 did not show any evidence of leak.  On 1/7, started on liquid diet which he was able to tolerate. On 1/8, General surgery followed up.  Advanced to regular diet.He was started on Augmentin  in the hospital.  He was discharged  on 05/10/2024 with 3 days of oral Augmentin  and daily Protonix .    He was initially seen in this office  referred here from the call center but previously seen at [facility]. On 05/06/2024  he presented here acutely ill  with constipation and abdominal pain. He ws directed to go immediately to ED after being evaluated here on Jan 5th. This was his third trip to the ED. He had gone to the ED two times prior where  on most recent  visit, he was prescribed Bentyl  20 mg, Reglan  10 mg and a Phenergan  25 mg suppository. When we saw him, he has not had a bowel movement since Sunday, December 28. His wife said that OTC pain relievers had not  helped his pain. He had vomited twice that day and vomitus was reported to be  a brownish color.  In  ED on 05/06/2024 U/A showed urine to be orange colored with moderate amount of Bilirubin and ketones, there was trace blood, Protein= 30 and a small amount of leukocytes. He now feels significant better. He is no longer feeling pain, and he has normal bowel movements. Appetite has improved.    History of Diabetes Mellitus, Type II treated with Mounjaro  2.5 mg one half injected once a week and 0.08 mL of insulin  injected daily. The last time he took Mounjaro  was the Monday December 29, onset of illness started shortly after.  He currently weighs 259 lbs BMI 38.25. He said that  his blood glucose runs around 105-120.    History of Hypertension treated with Azor  10-20 mg daily. Blood pressure  mildly elevated at 140/90 today. Azor  discontinued at the hospital.  Continue to monitor BP at home.  History of Hyperlipidemia treated with Rosuvastatin  10 mg daily.   Past Medical History:  Diagnosis Date   Diabetes (HCC)    Hypertension 2019   Obesity      Family History  Problem Relation Age of Onset   Cirrhosis Mother    Other Father        died of natural causes   Heart disease Neg Hx    Cancer Neg Hx    Diabetes Neg Hx     Social History   Social History Narrative   Patient lives at home with his spouse.  Works as  civil service fast streamer.  Exercise - none.  10/2023      Review of Systems  All other systems reviewed and are negative.       Objective:   Vitals: BP (!) 140/100   Pulse 84   Temp 98.4 F (36.9 C)   Ht 5' 9 (1.753 m)   Wt 259 lb (117.5 kg)   SpO2 98%   BMI 38.25 kg/m    Physical Exam Constitutional:      General: He is not in acute distress.    Appearance: Normal appearance. He is not ill-appearing.  HENT:     Head: Normocephalic and atraumatic.  Neck:     Vascular: No carotid bruit.  Cardiovascular:     Rate and Rhythm: Normal rate and regular rhythm.     Pulses: Normal pulses.     Heart sounds: Normal heart sounds. No murmur heard.    No friction rub. No gallop.  Pulmonary:     Effort: Pulmonary effort is normal. No respiratory distress.     Breath sounds: Normal breath sounds. No wheezing or rales.  Abdominal:     Palpations: There is no hepatomegaly, splenomegaly or mass.     Tenderness: There is no abdominal tenderness.  Skin:    General: Skin is warm and dry.  Neurological:     Mental Status: He is alert and oriented to person, place, and time. Mental status is at baseline.  Psychiatric:        Mood and Affect: Mood normal.        Behavior: Behavior normal.        Thought Content: Thought content normal.        Judgment: Judgment normal.       Results:    Labs:       Component Value Date/Time   NA 132 (L) 05/09/2024 0536   NA 137 10/31/2023 1546   K 3.6 05/09/2024 0536   CL 100 05/09/2024 0536   CO2 23 05/09/2024 0536   GLUCOSE 98 05/09/2024 0536   BUN 8 05/09/2024 0536   BUN 12 10/31/2023 1546   CREATININE 1.19 05/09/2024 0536   CALCIUM  8.6 (L) 05/09/2024 0536   PROT 6.5 05/09/2024 0536   PROT 7.2 10/31/2023 1546   ALBUMIN 4.0 05/09/2024 0536   ALBUMIN 4.7 10/31/2023 1546   AST 26 05/09/2024 0536   ALT 20 05/09/2024 0536   ALKPHOS 43 05/09/2024 0536   BILITOT 0.5 05/09/2024 0536   BILITOT 0.4 10/31/2023 1546   GFRNONAA >60 05/09/2024  0536     Lab Results  Component Value Date   WBC 3.6 (L) 05/09/2024   HGB 12.7 (L) 05/09/2024  HCT 38.1 (L) 05/09/2024   MCV 83.9 05/09/2024   PLT 231 05/09/2024    Lab Results  Component Value Date   CHOL 196 10/31/2023   HDL 49 10/31/2023   LDLCALC 126 (H) 10/31/2023   TRIG 115 10/31/2023   CHOLHDL 4.0 10/31/2023    Lab Results  Component Value Date   HGBA1C 6.0 (H) 05/07/2024     Lab Results  Component Value Date   TSH 3.170 10/31/2023     Lab Results  Component Value Date   PSA1 6.1 (H) 03/25/2024   PSA1 5.5 (H) 10/31/2023     Assessment & Plan:  Severe abdominal pain after being started on Mounjaro  for DM resulting in volume depletion. There was concern for microperforation in hospital. Endoscopy was not performed in hospital. He was given pain med and had bowel rest with slow advancement of his diet. He improved with these measures. Now needs GI evaluation and likely needs endoscopy to assess gastric. I as referring him to an Endocrinologist for medication selection. He will continue to advance diet slowly and continue checking accuchecks.he will call me if glucose gets significantly high again. He is being referred to [facility].He is very grateful to be feling better again.. Orders Placed This Encounter  Procedures   CBC with Differential/Platelet   Comprehensive metabolic panel with GFR   TSH   Ambulatory referral to Gastroenterology    Referral Priority:   Routine    Referral Type:   Consultation    Referral Reason:   Specialty Services Required    Referred to Provider:   Avram Lupita BRAVO, MD    Number of Visits Requested:   1   Ambulatory referral to Endocrinology    Referral Priority:   Routine    Referral Type:   Consultation    Referral Reason:   Specialty Services Required    Referred to Provider:   Shamleffer, Ibtehal Jaralla, MD    Number of Visits Requested:   1    Hospital Follow up: He presented to the ER because  of worsening abdominal pain with nausea vomiting. He  has been having abdominal pain for last 1 week. This is the third visit to the ER in the last 1 week. Pain is mostly in the epigastric area and lower quadrants. Has had multiple episodes of nausea and vomiting. Last bowel movement was almost a week ago. Patient was started on Mounjaro  about 6 weeks ago and has lost about 40 pounds per patient.  In the ER he had a CT abdomen pelvis which showed inflammation of the gastric antrum with tiny focus of air which could be either gastric diverticulum or microperforation. General surgeon Dr. Aron and gastroenterologist Dr. Rosalie  on call for consults were consulted. Patient admitted for further observation and management. Labs showed WBC 5.8 ,AST and ALT were 29 and 28 respectively. Creatinine 1.05.  On the third day of hospitalization, patient started having multiple episodes of fever as high as 103 degrees. Blood culture was collected, did not show any growth. 1/8, resp virus panel positive for Influenza A.  He was started on Tamiflu , Tessalon  Perles, Mucinex . Upper GI series on 1/6 did not show any evidence of leak. On 1/7, started on liquid diet which he was able to tolerate . On1/8, General surgery followed up.   Patient was  advanced to regular diet.    He was discharged  on 05/30/2024 with 3 days remaining of oral Augmentin  and Protonix .  He was last in this office on 05/06/2024 and presented with constipation and abdominal pain.  We encouraged wife to take him back to the Ed. Had 2 previous  ED evaluations for abdominal pain and nausea but was discharged home each time. He had gone to the ED two times previously where  on the last visit, he was prescribed Bentyl  20 mg, Reglan  10 mg and a Phenergan  25 mg suppository.. these meds did not help and he actually got worse with more severe abdominal pain and lethargy. At his initial eval here, His wife said that OTC pain relievers had not decreased his pain. He had  vomited twice that day and vomitus was a brownish color. 05/06/2024 Urinalysis Urine was orange, with moderate amount of Bilirubin and ketones, there was trace blood, Protein= 30 and a small amount of leukocytes. He feels significant better. Today he is in NAD and looks well hydrated. He is no longer feeling pain, he has normal bowel movements and appetite.    CBC, CMP and TSH collected today.   Referred to [facility] today.Likely needs endoscopy to follow up on gastric mucosa- concern for microperforation.   Diabetes Mellitus, Type II: treated with Mounjaro  2.5 mg one half injected once a week and 0.08 mL of insulin  injected daily. The last time he took Mounjaro  was the Monday December 29, onset of illness started shortly after.  He currently weighs 259 lbs BMI 38.25. He said that his blood glucose runs around 105-120.    Referred to Endocrinology to see if he is a candidate for GLP-1 med or needs something else.   Hypertension: treated with Azor  10-20 mg daily. Blood pressure elevated at 140/100.  Azor  discontinued at the hospital.    Hyperlipidemia: treated with Rosuvastatin  10 mg daily.   I will plan to see him in 3 months or sooner if necessary.  I,Makayla C Reid,acting as a scribe for Ronal JINNY Hailstone, MD.,have documented all relevant documentation on the behalf of Ronal JINNY Hailstone, MD,as directed by  Ronal JINNY Hailstone, MD while in the presence of Ronal JINNY Hailstone, MD.   I, Ronal JINNY Hailstone, MD, have reviewed all documentation for this visit. The documentation on 05/16/2024 for the exam, diagnosis, procedures, and orders are all accurate and complete.   "

## 2024-05-16 NOTE — Patient Instructions (Signed)
 It was a pleasure to see you today and know you are feeling better. We are making referral to [facility] for follow up of stomach issue and to [facility] for Diabetic management. I would like to see you again in 3 months.

## 2024-05-17 ENCOUNTER — Ambulatory Visit: Payer: Self-pay | Admitting: Internal Medicine

## 2024-05-17 LAB — CBC WITH DIFFERENTIAL/PLATELET
Absolute Lymphocytes: 3198 {cells}/uL (ref 850–3900)
Absolute Monocytes: 459 {cells}/uL (ref 200–950)
Basophils Absolute: 50 {cells}/uL (ref 0–200)
Basophils Relative: 0.9 %
Eosinophils Absolute: 118 {cells}/uL (ref 15–500)
Eosinophils Relative: 2.1 %
HCT: 39.5 % (ref 39.4–51.1)
Hemoglobin: 12.9 g/dL — ABNORMAL LOW (ref 13.2–17.1)
MCH: 27.6 pg (ref 27.0–33.0)
MCHC: 32.7 g/dL (ref 31.6–35.4)
MCV: 84.6 fL (ref 81.4–101.7)
MPV: 9.6 fL (ref 7.5–12.5)
Monocytes Relative: 8.2 %
Neutro Abs: 1775 {cells}/uL (ref 1500–7800)
Neutrophils Relative %: 31.7 %
Platelets: 310 Thousand/uL (ref 140–400)
RBC: 4.67 Million/uL (ref 4.20–5.80)
RDW: 13.2 % (ref 11.0–15.0)
Total Lymphocyte: 57.1 %
WBC: 5.6 Thousand/uL (ref 3.8–10.8)

## 2024-05-17 LAB — COMPREHENSIVE METABOLIC PANEL WITH GFR
AG Ratio: 1.7 (calc) (ref 1.0–2.5)
ALT: 25 U/L (ref 9–46)
AST: 26 U/L (ref 10–35)
Albumin: 4.2 g/dL (ref 3.6–5.1)
Alkaline phosphatase (APISO): 47 U/L (ref 35–144)
BUN: 8 mg/dL (ref 7–25)
CO2: 26 mmol/L (ref 20–32)
Calcium: 9 mg/dL (ref 8.6–10.3)
Chloride: 106 mmol/L (ref 98–110)
Creat: 0.92 mg/dL (ref 0.70–1.35)
Globulin: 2.5 g/dL (ref 1.9–3.7)
Glucose, Bld: 87 mg/dL (ref 65–99)
Potassium: 3.7 mmol/L (ref 3.5–5.3)
Sodium: 141 mmol/L (ref 135–146)
Total Bilirubin: 0.4 mg/dL (ref 0.2–1.2)
Total Protein: 6.7 g/dL (ref 6.1–8.1)
eGFR: 93 mL/min/1.73m2

## 2024-05-17 LAB — TSH: TSH: 1.8 m[IU]/L (ref 0.40–4.50)

## 2024-05-22 NOTE — Addendum Note (Signed)
 Addended by: PERRI RONAL PARAS on: 05/22/2024 01:39 PM   Modules accepted: Level of Service

## 2024-06-07 ENCOUNTER — Encounter: Payer: Self-pay | Admitting: Medical

## 2024-08-16 ENCOUNTER — Other Ambulatory Visit

## 2024-08-23 ENCOUNTER — Ambulatory Visit: Admitting: Internal Medicine
